# Patient Record
Sex: Male | Born: 1950 | Hispanic: No | Marital: Married | Smoking: Never smoker
Health system: Southern US, Community
[De-identification: ages and names within clinical notes are randomized; demographics above are authoritative.]

## PROBLEM LIST (undated history)

## (undated) DIAGNOSIS — I10 Essential (primary) hypertension: Secondary | ICD-10-CM

---

## 2017-01-30 ENCOUNTER — Ambulatory Visit: Payer: PRIVATE HEALTH INSURANCE | Attending: Internal Medicine

## 2017-01-30 DIAGNOSIS — M5441 Lumbago with sciatica, right side: Secondary | ICD-10-CM | POA: Diagnosis present

## 2017-01-30 DIAGNOSIS — M6281 Muscle weakness (generalized): Secondary | ICD-10-CM | POA: Diagnosis present

## 2017-01-30 DIAGNOSIS — G8929 Other chronic pain: Secondary | ICD-10-CM | POA: Diagnosis present

## 2017-01-30 DIAGNOSIS — M5442 Lumbago with sciatica, left side: Secondary | ICD-10-CM | POA: Diagnosis present

## 2017-01-30 NOTE — Therapy (Signed)
[facility] PHYS[facility] MEDICINE [address] [place], [address] Phone: [phone]   Fax:  [phone]  Physical Therapy Treatment  Patient Details  Name: Corey Mendoza. Ellery MRN: [id number] Date of Birth: [birth year] Referring Provider: Nino Glow MD   Encounter Date: 01/30/2017  PT End of Session - 01/30/17 1208    Visit Number  1    Number of Visits  13    Date for PT Re-Evaluation  03/13/17    PT Start Time  1015    PT Stop Time  1115    PT Time Calculation (min)  60 min    Activity Tolerance  Patient tolerated treatment well    Behavior During Therapy  [facility] for tasks assessed/performed       History reviewed. No pertinent past medical history.  History reviewed. No pertinent surgical history.  There were no vitals filed for this visit.  Subjective Assessment - 01/30/17 1143    Subjective  Patient reports he is currently having B leg numbness/pain radiating from the lumbar into the posterior aspect of the lower legs. Patient reports increased calf pain after walking 2 blocks, standing for ~ 3 min, and all standing weight bearing activities. Patient reports the pain is worse in the afternoon/evening and better in the morning. Patient reports the pain usually starts in the feet/back and gets worse the longer he stands. Patient reports he would like to return to walking and working out without increase in pain. Patient reports the pain has not been improving since onset  1 month ago.    Pertinent History  History of LBP, HTN    Limitations  Standing;Walking    How long can you stand comfortably?  3 min    How long can you walk comfortably?  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  Yes worst: 9/10; best 5/10    Pain Score  5     Pain Location  Back    Pain Orientation  Right;Left    Pain Descriptors /  Indicators  Aching;Numbness    Pain Type  Chronic pain    Pain Onset  More than a month ago    Pain Frequency  Constant         [facility] PT Assessment - 01/30/17 1025      Assessment   Medical Diagnosis  Chronic back pain m54.5    Referring Provider  Nino Glow MD    Onset Date/Surgical Date  01/01/17    Hand Dominance  Right    Next MD Visit  unknown     Prior Therapy  yes - back      Balance Screen   Has the patient fallen in the past 6 months  No    Has the patient had a decrease in activity level because of a fear of falling?   Yes    Is the patient reluctant to leave their home because of a fear of falling?   No      Home Environment   Living Environment  Private residence    Living Arrangements  Spouse/significant other    Available Help at Discharge  Family    Type of Home  House    Home Access  Level entry    Home Layout  Two level  Alternate Level Stairs-Number of Steps  15    Alternate Level Stairs-Rails  Right      Prior Function   Level of Independence  Independent    Vocation  Retired    [employer] Requirements  N/A    Leisure  Gym,  swimming, movies, table tennis, running      Cognition   Overall Cognitive Status  Within Functional Limits for tasks assessed      Observation/Other Assessments   Observations  decreased glute activation with general cueing requires cueing to correct      Sensation   Additional Comments  Decreased Along S1-2 dermatomes to light touch B       Posture/Postural Control   Posture Comments  Forward rounded shoulders      ROM / Strength   AROM / PROM / Strength  Strength;AROM      AROM   AROM Assessment Site  Lumbar;Hip    Right/Left Hip  Right;Left    Right Hip Extension  10    Right Hip Flexion  120    Right Hip ABduction  40    Right Hip ADduction  20    Left Hip Extension  10    Left Hip Flexion  120    Left Hip ABduction  40    Left Hip ADduction  20    Lumbar Flexion  WNL    Lumbar Extension  75% limited  increased pain    Lumbar - Right Side Bend  WNL    Lumbar - Left Side Bend  WNL    Lumbar - Right Rotation  WNL    Lumbar - Left Rotation  WNL      Strength   Strength Assessment Site  Hip;Knee;Ankle    Right/Left Hip  Right;Left    Right Hip Flexion  4+/5    Right Hip Extension  4-/5    Right Hip ABduction  4/5    Right Hip ADduction  4/5    Left Hip Flexion  4+/5    Left Hip Extension  4-/5    Left Hip ABduction  4/5    Left Hip ADduction  4/5    Right/Left Knee  Right;Left    Right Knee Flexion  4+/5    Right Knee Extension  5/5    Left Knee Flexion  4+/5    Left Knee Extension  5/5    Right/Left Ankle  Right;Left    Right Ankle Dorsiflexion  4-/5    Right Ankle Plantar Flexion  4-/5    Right Ankle Inversion  4+/5    Left Ankle Dorsiflexion  4-/5    Left Ankle Plantar Flexion  4-/5    Left Ankle Inversion  4+/5      Palpation   Palpation comment  Reproduction of symptoms with PA mobs to L1, L4-5; TTP over multifidi B      Special Tests    Special Tests  Lumbar    Lumbar Tests  Slump Test;FABER test;Prone Knee Bend Test;Straight Leg Raise      FABER test   findings  Negative    Comment  Bilat      Slump test   Findings  Positive    Side  Right      Prone Knee Bend Test   Findings  Positive    Side  Left      Straight Leg Raise   Findings  Negative    Comment  bilat      Ambulation/Gait  Gait Comments  slight hip ER with ambulation        TREATMENT: Therapeutic Exercise: Prone press ups -- 2 x 10  Prone lying on elbows -- x 2 min Bridges -- x 5   Patient demonstrates less Rt leg pain; but greater Lt leg pain at end of session     PT Education - 01/30/17 1203    Education provided  Yes    Education Details  Prone lying for 15 min    Person(s) Educated  Patient    Methods  Explanation;Demonstration    Comprehension  Verbalized understanding;Returned demonstration          PT Long Term Goals - 01/30/17 1239      PT LONG TERM GOAL #1    Title  Patient will be independent with HEP to continue benefits of therapy after discharge.    Baseline  Patient dependent with exercise performance/technique    Time  6    Period  Weeks    Status  New    Target Date  02/27/17      PT LONG TERM GOAL #2   Title  Patient will be able to stand for 1 hour without increase in pain to better be able to perform recreational and functional activities such as table tennis    Baseline  3 min     Time  6    Period  Weeks    Status  New    Target Date  03/13/17      PT LONG TERM GOAL #3   Title  Patient will be able to walk and workout without increase in pain to better be able to maintain function without increased pain.    Baseline  can walk 900 ft before requiring to sit down    Time  6    Period  Weeks    Status  New    Target Date  03/13/17            Plan - 01/30/17 1232    Clinical Impression Statement  Patient is a R hand dominant male presenting with increased pain and numbness along his low back and posterior aspect of his lower legs. Patient demonstrates lumbar dysfunction with possible disc involvement with increased pain with SLUMP, radicular pain B and increased pain with performing extension based movement. Patient also demonstrates decreased standing tolerance and calf/ hip weakness B. Patient will benefit from further skilled therapy focused on improving limitations to return to prior level of function.     History and Personal Factors relevant to plan of care:  Chronic LBP, HTN    Clinical Presentation  Evolving    Clinical Presentation due to:  Pain not improving since onset    Clinical Decision Making  High    Rehab Potential  Fair    Clinical Impairments Affecting Rehab Potential  (-) pain radiating down B LEs    PT Frequency  2x / week    PT Duration  6 weeks    PT Treatment/Interventions  Therapeutic activities;Therapeutic exercise;Moist Heat;Cryotherapy;Ultrasound;Electrical Stimulation;Iontophoresis 4mg /ml  Dexamethasone;Neuromuscular re-education;Patient/family education;Passive range of motion;Manual techniques;Dry needling    PT Next Visit Plan  Progress positioning    PT Home Exercise Plan  See education    Consulted and Agree with Plan of Care  Patient       Patient will benefit from skilled therapeutic intervention in order to improve the following deficits and impairments:  Abnormal gait, Pain, Decreased mobility, Decreased range of motion, Decreased  endurance, Decreased activity tolerance, Difficulty walking, Decreased balance, Increased muscle spasms  Visit Diagnosis: Chronic bilateral low back pain with bilateral sciatica - Plan: PT plan of care cert/re-cert  Muscle weakness (generalized) - Plan: PT plan of care cert/re-cert     Problem List There are no active problems to display for this patient.   [name], PT DPT 01/30/2017, 12:53 PM  [place] [facility] PHYSICAL AND SPORTS MEDICINE [address], [place], [address] Phone: [phone]   Fax:  [phone]  Name: Eligha Kmetz. Fariss MRN: [id number] Date of Birth: Aug 02, 1950

## 2017-02-04 ENCOUNTER — Ambulatory Visit: Payer: PRIVATE HEALTH INSURANCE | Attending: Internal Medicine

## 2017-02-04 DIAGNOSIS — G8929 Other chronic pain: Secondary | ICD-10-CM | POA: Insufficient documentation

## 2017-02-04 DIAGNOSIS — M6281 Muscle weakness (generalized): Secondary | ICD-10-CM | POA: Diagnosis not present

## 2017-02-04 DIAGNOSIS — M5442 Lumbago with sciatica, left side: Secondary | ICD-10-CM | POA: Insufficient documentation

## 2017-02-04 DIAGNOSIS — M5441 Lumbago with sciatica, right side: Secondary | ICD-10-CM | POA: Diagnosis present

## 2017-02-04 NOTE — Therapy (Signed)
[place] [facility]ICINE [address], [place], [address] Phone: [phone]   Fax:  [phone]  Physical Therapy Treatment  Patient Details  Name: Corey Mendoza MRN: [id number] Date of Birth: [birth year] Referring Provider: Nino Glow MD   Encounter Date: 02/04/2017  PT End of Session - 02/04/17 0944    Visit Number  2    Number of Visits  13    Date for PT Re-Evaluation  03/13/17    PT Start Time  0845    PT Stop Time  0930    PT Time Calculation (min)  45 min    Activity Tolerance  Patient tolerated treatment well    Behavior During Therapy  [facility] for tasks assessed/performed       History reviewed. No pertinent past medical history.  History reviewed. No pertinent surgical history.  There were no vitals filed for this visit.  Subjective Assessment - 02/04/17 0900    Subjective  Patient reports his pain is not improved compared to the previous session. Patient states he continues to have pain radiating down his legs bilaterally.     Pertinent History  History of LBP, HTN    Limitations  Standing;Walking    How long can you stand comfortably?  3 min    How long can you walk comfortably?  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  Yes    Pain Score  6     Pain Location  Back    Pain Orientation  Right;Left    Pain Descriptors / Indicators  Aching;Numbness    Pain Onset  More than a month ago    Pain Frequency  Constant       TREATMENT Therapeutic Exercise LTR with physioball - x30 B in hooklying Gastroc isometrics in supine - x20 with 5 sec holds B against PT resitan Knees to chest with physioball - x 40 with 2 sec holds in hooklying Dead bugs in prone - x 20 with cueing to perform abdominal bracing Bridges - x 5 stopped secondary to pain Prone on elbows - stopped secondary to  increased numbness/tingling Lying prone - initially decreases pain in feet - increased after attempt to perform prone press ups  Manual Therapy: Mobilizations: L1-5 - grade III-IV 2 x 30sec to improve extension Range of motion; no pain with performance today, which is an improvement compared to the previous session.  Modalities [place] with patient positioned in prone to improve pain and spasm using small sound head over the lumbar spine. Parameters 1 MHz, 1.8 mWs, , and a 100% duty cycle.   Patient reports no increase in pain at the end of the session   PT Education - 02/04/17 0942    Education provided  Yes    Education Details  HEP: knees to chest, resisted isometrics for gastroc,, deadbug    Person(s) Educated  Patient    Methods  Explanation;Demonstration    Comprehension  Verbalized understanding;Returned demonstration          PT Long Term Goals - 01/30/17 1239      PT LONG TERM GOAL #1   Title  Patient will be independent with HEP to continue benefits of therapy after discharge.    Baseline  Patient dependent with exercise performance/technique    Time  6  Period  Weeks    Status  New    Target Date  02/27/17      PT LONG TERM GOAL #2   Title  Patient will be able to stand for 1 hour without increase in pain to better be able to perform recreational and functional activities such as table tennis    Baseline  3 min     Time  6    Period  Weeks    Status  New    Target Date  03/13/17      PT LONG TERM GOAL #3   Title  Patient will be able to walk and workout without increase in pain to better be able to maintain function without increased pain.    Baseline  can walk 900 ft before requiring to sit down    Time  6    Period  Weeks    Status  New    Target Date  03/13/17            Plan - 02/04/17 0944    Clinical Impression Statement  Patient continues to demonstrate increased pain and numbness along B posterior aspect of the lower legs. Patient  demonstrates decreased pain with performing prone lying, but symptoms increase after trying to perform prone press ups and increased extension passed neutral. Patient reports decrease in symtpoms with gastroc strengthening and knees to chest exercises. Patient will benefit from further skilled therapy to return to prior level of function.     Rehab Potential  Fair    Clinical Impairments Affecting Rehab Potential  (-) pain radiating down B LEs    PT Frequency  2x / week    PT Duration  6 weeks    PT Treatment/Interventions  Therapeutic activities;Therapeutic exercise;Moist Heat;Cryotherapy;Ultrasound;Electrical Stimulation;Iontophoresis 4mg /ml Dexamethasone;Neuromuscular re-education;Patient/family education;Passive range of motion;Manual techniques;Dry needling    PT Next Visit Plan  Progress positioning    PT Home Exercise Plan  See education    Consulted and Agree with Plan of Care  Patient       Patient will benefit from skilled therapeutic intervention in order to improve the following deficits and impairments:  Abnormal gait, Pain, Decreased mobility, Decreased range of motion, Decreased endurance, Decreased activity tolerance, Difficulty walking, Decreased balance, Increased muscle spasms  Visit Diagnosis: Muscle weakness (generalized)  Chronic bilateral low back pain with bilateral sciatica     Problem List There are no active problems to display for this patient.   Myrene GalasWesley Tyrann Donaho, PT DPT 02/04/2017, 10:30 AM  [place] [facility] [address] [place], [place], [address] Phone: [phone]   Fax:  [phone]  Name: Corey Mendoza MRN: [id number] Date of Birth: April 12, 1950

## 2017-02-06 ENCOUNTER — Ambulatory Visit: Payer: PRIVATE HEALTH INSURANCE

## 2017-02-12 ENCOUNTER — Ambulatory Visit: Payer: PRIVATE HEALTH INSURANCE

## 2017-02-12 DIAGNOSIS — M6281 Muscle weakness (generalized): Secondary | ICD-10-CM | POA: Diagnosis not present

## 2017-02-12 DIAGNOSIS — G8929 Other chronic pain: Secondary | ICD-10-CM

## 2017-02-12 DIAGNOSIS — M5441 Lumbago with sciatica, right side: Principal | ICD-10-CM

## 2017-02-12 DIAGNOSIS — M5442 Lumbago with sciatica, left side: Principal | ICD-10-CM

## 2017-02-12 NOTE — Therapy (Signed)
[place] [facility] PH[facility]TS MEDICINE [address], [place], [address] Phone: [phone]   Fax:  [phone]  Physical Therapy Treatment  Patient Details  Name: Corey Mendoza MRN: [id number] Date of Birth: Dec 23, 1950 Referring Provider: Nino Glow MD   Encounter Date: 02/12/2017  PT End of Session - 02/12/17 1439    Visit Number  3    Number of Visits  13    Date for PT Re-Evaluation  03/13/17    PT Start Time  1345    PT Stop Time  1430    PT Time Calculation (min)  45 min    Activity Tolerance  Patient tolerated treatment well    Behavior During Therapy  [facility] for tasks assessed/performed       History reviewed. No pertinent past medical history.  History reviewed. No pertinent surgical history.  There were no vitals filed for this visit.  Subjective Assessment - 02/12/17 1432    Subjective  Patient reports his pain has not improved since the previous visit. Patient reports he has not been performing his HEP.     Pertinent History  History of LBP, HTN    Limitations  Standing;Walking    How long can you stand comfortably?  3 min    How long can you walk comfortably?  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  Yes    Pain Score  5     Pain Location  Leg    Pain Orientation  Lower    Pain Descriptors / Indicators  Aching;Numbness    Pain Type  Chronic pain    Pain Onset  More than a month ago    Pain Frequency  Constant       TREATMENT  Manual Therapy: STM performed to patients gastrocnemius/soleus complex with patient positioned in prone B to improve pain and spasms along the gastroc/soleus area. ~12 min performed on each side. Nerve mobilizations performed in prone to improve neuro tension on the R side -- x 20   Modalities [place] with patient positioned in prone to improve pain and spasm using  small sound head over the lumbar spine. Parameters 1 MHz, 2.5 W/cm2s, , and a 100% duty cycle.   Patient reports decrease in pain at end of the session      PT Education - 02/12/17 1439    Education provided  Yes    Education Details  neurophysiology    Person(s) Educated  Patient    Methods  Explanation;Demonstration    Comprehension  Verbalized understanding;Returned demonstration          PT Long Term Goals - 01/30/17 1239      PT LONG TERM GOAL #1   Title  Patient will be independent with HEP to continue benefits of therapy after discharge.    Baseline  Patient dependent with exercise performance/technique    Time  6    Period  Weeks    Status  New    Target Date  02/27/17      PT LONG TERM GOAL #2   Title  Patient will be able to stand for 1 hour without increase in pain to better be able to perform recreational and functional activities such as table tennis    Baseline  3 min     Time  6    Period  Weeks    Status  New    Target Date  03/13/17      PT LONG TERM GOAL #3   Title  Patient will be able to walk and workout without increase in pain to better be able to maintain function without increased pain.    Baseline  can walk 900 ft before requiring to sit down    Time  6    Period  Weeks    Status  New    Target Date  03/13/17            Plan - 02/12/17 1440    Clinical Impression Statement  Patient continues to demonstrate decreased pain after performing manual therapy indicating decreased pain and spasms along the calves. Performed all manual therapy and modalities in prone to improve lumbar positioning and mackenzie based extension. Although patient has improvement in symptoms, he continues to have increased pain after walking. Patient will benefit from further skilled therapy to return to prior level of function.     Rehab Potential  Fair    Clinical Impairments Affecting Rehab Potential  (-) pain radiating down B LEs    PT Frequency  2x /  week    PT Duration  6 weeks    PT Treatment/Interventions  Therapeutic activities;Therapeutic exercise;Moist Heat;Cryotherapy;Ultrasound;Electrical Stimulation;Iontophoresis 4mg /ml Dexamethasone;Neuromuscular re-education;Patient/family education;Passive range of motion;Manual techniques;Dry needling    PT Next Visit Plan  Progress positioning    PT Home Exercise Plan  See education    Consulted and Agree with Plan of Care  Patient       Patient will benefit from skilled therapeutic intervention in order to improve the following deficits and impairments:  Abnormal gait, Pain, Decreased mobility, Decreased range of motion, Decreased endurance, Decreased activity tolerance, Difficulty walking, Decreased balance, Increased muscle spasms  Visit Diagnosis: Chronic bilateral low back pain with bilateral sciatica  Muscle weakness (generalized)     Problem List There are no active problems to display for this patient.   Myrene GalasWesley Idamae Coccia, PT DPT 02/12/2017, 2:48 PM  [place] [facility] [address] [place], [place], [address] Phone: [phone]   Fax:  [phone]  Name: Corey Mendoza MRN: [id number] Date of Birth: [birth year]

## 2017-02-14 ENCOUNTER — Ambulatory Visit: Payer: PRIVATE HEALTH INSURANCE

## 2017-02-14 DIAGNOSIS — M6281 Muscle weakness (generalized): Secondary | ICD-10-CM | POA: Diagnosis not present

## 2017-02-14 DIAGNOSIS — M5441 Lumbago with sciatica, right side: Principal | ICD-10-CM

## 2017-02-14 DIAGNOSIS — G8929 Other chronic pain: Secondary | ICD-10-CM

## 2017-02-14 DIAGNOSIS — M5442 Lumbago with sciatica, left side: Principal | ICD-10-CM

## 2017-02-14 NOTE — Therapy (Signed)
[place] [facility]INE [address] [place], [address] Phone: [phone]   Fax:  [phone]  Physical Therapy Treatment  Patient Details  Name: Corey Mendoza MRN: [id number] Date of Birth: 12/20/1950 Referring Provider: Nino Glow MD   Encounter Date: 02/14/2017  PT End of Session - 02/14/17 1539    Visit Number  4    Number of Visits  13    Date for PT Re-Evaluation  03/13/17    PT Start Time  1500    PT Stop Time  1545    PT Time Calculation (min)  45 min    Activity Tolerance  Patient tolerated treatment well    Behavior During Therapy  [facility] for tasks assessed/performed       History reviewed. No pertinent past medical history.  History reviewed. No pertinent surgical history.  There were no vitals filed for this visit.  Subjective Assessment - 02/14/17 1536    Subjective  Patient reports his pain has improved 50% since the previous visit. Reports he has stopped drinking and his pain has been better. Patient also reports what we did the previous treatment session has helped his paiin considerably.     Pertinent History  History of LBP, HTN    Limitations  Standing;Walking    How long can you stand comfortably?  3 min    How long can you walk comfortably?  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  Yes    Pain Score  3     Pain Location  Leg    Pain Orientation  Lower    Pain Onset  More than a month ago    Pain Frequency  Constant         TREATMENT Manual Therapy: STM performed to patients gastrocnemius/soleus complex with patient positioned in prone B to improve pain and spasms along the gastroc/soleus area. ~12 min performed on each side. Nerve mobilizations performed in sitting to improve neuro tension on the R side -- x 20   Therapeutic Exercise: Birddog -- x10  B Deadbug -- x10 B Thoracic sit ups -- x10  Dry Needling:  Performed to gastrocnemius (3) .25 x 60mm needles placed along the gastrocs to decrease increased pain and spasms with patient positioned in prone. Patient gives verbal consents to treatment and is educated on precautions and benefits of treatment  Modalities [place] with patient positioned in prone to improve pain and spasm using small sound head over the lumbar spine. Parameters 1 MHz, 2.5 W/cm2s, , and a 100% duty cycle.   Patient reports decrease in pain at end of the session    PT Education - 02/14/17 1538    Education provided  Yes    Education Details  HEP: bird dog, dead bug, thoracic sit up    Person(s) Educated  Patient    Methods  Explanation;Demonstration    Comprehension  Returned demonstration;Verbalized understanding          PT Long Term Goals - 01/30/17 1239      PT LONG TERM GOAL #1   Title  Patient will be independent with HEP to continue benefits of therapy after discharge.    Baseline  Patient dependent with exercise performance/technique    Time  6    Period  Weeks  Status  New    Target Date  02/27/17      PT LONG TERM GOAL #2   Title  Patient will be able to stand for 1 hour without increase in pain to better be able to perform recreational and functional activities such as table tennis    Baseline  3 min     Time  6    Period  Weeks    Status  New    Target Date  03/13/17      PT LONG TERM GOAL #3   Title  Patient will be able to walk and workout without increase in pain to better be able to maintain function without increased pain.    Baseline  can walk 900 ft before requiring to sit down    Time  6    Period  Weeks    Status  New    Target Date  03/13/17            Plan - 02/14/17 1540    Clinical Impression Statement  Patient demonstrates decreased pain after performing manual therapy and ultrasound indicating decreased muscular spasms and pain along the gastrocs.  Although patient is improving, he continues to have increased numbness around the gastrocs. Attempted abdominal bracing and neuroglides with therapy today and patient responds well with no increase in pain. Patient will benefit from further skilled therapy focused on improving limitations to return to prior level of function.      Rehab Potential  Fair    Clinical Impairments Affecting Rehab Potential  (-) pain radiating down B LEs    PT Frequency  2x / week    PT Duration  6 weeks    PT Treatment/Interventions  Therapeutic activities;Therapeutic exercise;Moist Heat;Cryotherapy;Ultrasound;Electrical Stimulation;Iontophoresis 4mg /ml Dexamethasone;Neuromuscular re-education;Patient/family education;Passive range of motion;Manual techniques;Dry needling    PT Next Visit Plan  Progress positioning    PT Home Exercise Plan  See education    Consulted and Agree with Plan of Care  Patient       Patient will benefit from skilled therapeutic intervention in order to improve the following deficits and impairments:  Abnormal gait, Pain, Decreased mobility, Decreased range of motion, Decreased endurance, Decreased activity tolerance, Difficulty walking, Decreased balance, Increased muscle spasms  Visit Diagnosis: Chronic bilateral low back pain with bilateral sciatica  Muscle weakness (generalized)     Problem List There are no active problems to display for this patient.   Myrene GalasWesley San Lohmeyer, PT DPT 02/14/2017, 3:43 PM  [facility] [address] [place], [place], [address] Phone: [phone]   Fax:  [phone]  Name: Corey Mendoza MRN: [id number] Date of Birth: 1950/03/22

## 2017-02-18 ENCOUNTER — Ambulatory Visit: Payer: PRIVATE HEALTH INSURANCE

## 2017-02-18 DIAGNOSIS — M5442 Lumbago with sciatica, left side: Principal | ICD-10-CM

## 2017-02-18 DIAGNOSIS — G8929 Other chronic pain: Secondary | ICD-10-CM

## 2017-02-18 DIAGNOSIS — M6281 Muscle weakness (generalized): Secondary | ICD-10-CM

## 2017-02-18 DIAGNOSIS — M5441 Lumbago with sciatica, right side: Principal | ICD-10-CM

## 2017-02-18 NOTE — Therapy (Signed)
[place] [facility] [address] , [place], [address] Phone: [phone]   Fax:  [phone]  Physical Therapy Treatment  Patient Details  Name: Corey Mendoza MRN: [id number] Date of Birth: May 21, 1950 Referring Provider: Nino GlowMelam Khan MD   Encounter Date: 02/18/2017  PT End of Session - 02/18/17 1407    Visit Number  5    Number of Visits  13    Date for PT Re-Evaluation  03/13/17    PT Start Time  1300    PT Stop Time  1345    PT Time Calculation (min)  45 min    Activity Tolerance  Patient tolerated treatment well    Behavior During Therapy  [facility] tasks assessed/performed       History reviewed. No pertinent past medical history.  History reviewed. No pertinent surgical history.  There were no vitals filed for this visit.  Subjective Assessment - 02/18/17 1301    Subjective  Patient reports his pain was worse the pain afterwards but felt better the next two days following the last session.     Pertinent History  History of LBP, HTN    Limitations  Standing;Walking    How long can you stand comfortably?  3 min    How long can you walk comfortably?  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  Yes    Pain Score  4     Pain Location  Leg    Pain Orientation  Lower    Pain Descriptors / Indicators  Aching    Pain Type  Chronic pain    Pain Onset  More than a month ago    Pain Frequency  Constant        TREATMENT Manual Therapy: STM performed to patients gastrocnemius/soleus complex with patient positioned in prone B to improve pain and spasms along the gastroc/soleus area. ~12 min performed on each side. Nerve mobilizations performed in sitting to improve neuro tension on the R side -- x 20    Therapeutic Exercise: Hip Abduction in sidelying - x 10 with frequent cueing from  PT Glute squeezes in prone - x 10    Modalities [place] with patient positioned in prone to improve pain and spasm using small sound head over the lumbar spine. Parameters 1 MHz, 2.2 W/cm2s, 10min, and a 100% duty cycle.    Patient reports decrease in pain at end of the session  PT Education - 02/18/17 1406    Education provided  Yes    Education Details  HEP: glute squeezes, hip abduction in s/l    Person(s) Educated  Patient    Methods  Explanation;Demonstration    Comprehension  Verbalized understanding;Returned demonstration          PT Long Term Goals - 01/30/17 1239      PT LONG TERM GOAL #1   Title  Patient will be independent with HEP to continue benefits of therapy after discharge.    Baseline  Patient dependent with exercise performance/technique    Time  6    Period  Weeks    Status  New    Target Date  02/27/17      PT LONG TERM GOAL #2   Title  Patient will be able to stand for 1 hour without increase  in pain to better be able to perform recreational and functional activities such as table tennis    Baseline  3 min     Time  6    Period  Weeks    Status  New    Target Date  03/13/17      PT LONG TERM GOAL #3   Title  Patient will be able to walk and workout without increase in pain to better be able to maintain function without increased pain.    Baseline  can walk 900 ft before requiring to sit down    Time  6    Period  Weeks    Status  New    Target Date  03/13/17            Plan - 02/18/17 1408    Clinical Impression Statement  Patient demonstrates decreased pain after performing manual therapy mobilizations to the SIJ and patient demonstrates increased atrophy along his gluteals B. Patient is improving with resitng pain levels, but continues to have increased pain with prolonged standing and ambulation. Patient will benefit from further skilled therapy to return to prior level of function.     Rehab Potential  Fair    Clinical Impairments Affecting  Rehab Potential  (-) pain radiating down B LEs    PT Frequency  2x / week    PT Duration  6 weeks    PT Treatment/Interventions  Therapeutic activities;Therapeutic exercise;Moist Heat;Cryotherapy;Ultrasound;Electrical Stimulation;Iontophoresis 4mg /ml Dexamethasone;Neuromuscular re-education;Patient/family education;Passive range of motion;Manual techniques;Dry needling    PT Next Visit Plan  Progress positioning    PT Home Exercise Plan  See education    Consulted and Agree with Plan of Care  Patient       Patient will benefit from skilled therapeutic intervention in order to improve the following deficits and impairments:  Abnormal gait, Pain, Decreased mobility, Decreased range of motion, Decreased endurance, Decreased activity tolerance, Difficulty walking, Decreased balance, Increased muscle spasms  Visit Diagnosis: Chronic bilateral low back pain with bilateral sciatica  Muscle weakness (generalized)     Problem List There are no active problems to display for this patient.   [name], PT DPT 02/18/2017, 2:41 PM  [facility] PHYSICAL AND SPORTS MEDICINE [address] [place], [address] Phone: [phone]   Fax:  [phone]  Name: Corey Mendoza MRN: [id number] Date of Birth: July 20, 1950

## 2017-02-21 ENCOUNTER — Ambulatory Visit: Payer: PRIVATE HEALTH INSURANCE

## 2017-02-21 DIAGNOSIS — G8929 Other chronic pain: Secondary | ICD-10-CM

## 2017-02-21 DIAGNOSIS — M6281 Muscle weakness (generalized): Secondary | ICD-10-CM | POA: Diagnosis not present

## 2017-02-21 DIAGNOSIS — M5441 Lumbago with sciatica, right side: Principal | ICD-10-CM

## 2017-02-21 DIAGNOSIS — M5442 Lumbago with sciatica, left side: Principal | ICD-10-CM

## 2017-02-21 NOTE — Therapy (Signed)
[place] [facility] [address] [place], [place], [address] Phone: [phone]   Fax:  [phone]  Physical Therapy Treatment  Patient Details  Name: Corey Mendoza MRN: [id number] Date of Birth: 1950/08/28 Referring Provider: Nino GlowMelam Khan MD   Encounter Date: 02/21/2017  PT End of Session - 02/21/17 1433    Visit Number  6    Number of Visits  13    Date for PT Re-Evaluation  03/13/17    PT Start Time  1335    PT Stop Time  1447    PT Time Calculation (min)  72 min    Activity Tolerance  Patient tolerated treatment well    Behavior During Therapy  [facility] assessed/[facility]       History reviewed. No pertinent past medical history.  History reviewed. No pertinent surgical history.  There were no vitals filed for this visit.  Subjective Assessment - 02/21/17 1340    Subjective  Patient states he has "a little bit" of pain today but that it is much better compared to when he started coming to PT.    Pertinent History  History of LBP, HTN    Limitations  Standing;Walking    How long can you stand comfortably?  3 min    How long can you walk comfortably?  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  Yes    Pain Score  4     Pain Location  Leg    Pain Orientation  Lower    Pain Descriptors / Indicators  Aching    Pain Type  Chronic pain    Pain Onset  More than a month ago    Pain Frequency  Constant        Treatment   Manual Therapy:   STM utilizing superficial technique and tool assisted STM performed to patients gastrocnemius/soleus complex with patient positioned in prone B to improve pain and spasms along the gastroc/soleus area.    Therapeutic Exercise: Prone press ups - x8 to decrease LBP Standing back extension - x8 Heel raises 1x20 to increase endurance of the  plantarflexors Standing hip extension - 2x10 to decrease LBP   Modalities (20min): with patient in prone   One Large moist heat pad placed along the low back with patient positioned in prone to decrease pain and spasms within the low back musculature. High volt placed along lumbar multifidus on the R and L side to decrease increased pain and spasms; two large pads on the R and L lumbar multifidus. Patient educated on treatment    Pt demonstrates decreased pain at end of session   PT Education - 02/21/17 1433    Education provided  Yes    Education Details  Form/technique with exercise    Person(s) Educated  Patient    Methods  Explanation;Demonstration    Comprehension  Verbalized understanding;Returned demonstration          PT Long Term Goals - 01/30/17 1239      PT LONG TERM GOAL #1   Title  Patient will be independent with HEP to continue benefits of therapy after discharge.    Baseline  Patient dependent with exercise performance/technique    Time  6    Period  Weeks    Status  New  Target Date  02/27/17      PT LONG TERM GOAL #2   Title  Patient will be able to stand for 1 hour without increase in pain to better be able to perform recreational and functional activities such as table tennis    Baseline  3 min     Time  6    Period  Weeks    Status  New    Target Date  03/13/17      PT LONG TERM GOAL #3   Title  Patient will be able to walk and workout without increase in pain to better be able to maintain function without increased pain.    Baseline  can walk 900 ft before requiring to sit down    Time  6    Period  Weeks    Status  New    Target Date  03/13/17            Plan - 02/21/17 1433    Clinical Impression Statement  Patient continues to demonstrate overall decrease in pain between sessions, indicating functional carryover between sessoins. Although patient demonstrates improvement, pt continues to demonstrate increased pain with exercise  performed in standing. Pt will benefit from further skilled therapy to return to prior levels of function.    Rehab Potential  Fair    Clinical Impairments Affecting Rehab Potential  (-) pain radiating down B LEs    PT Frequency  2x / week    PT Duration  6 weeks    PT Treatment/Interventions  Therapeutic activities;Therapeutic exercise;Moist Heat;Cryotherapy;Ultrasound;Electrical Stimulation;Iontophoresis 4mg /ml Dexamethasone;Neuromuscular re-education;Patient/family education;Passive range of motion;Manual techniques;Dry needling    PT Next Visit Plan  Progress positioning    PT Home Exercise Plan  See education    Consulted and Agree with Plan of Care  Patient       Patient will benefit from skilled therapeutic intervention in order to improve the following deficits and impairments:  Abnormal gait, Pain, Decreased mobility, Decreased range of motion, Decreased endurance, Decreased activity tolerance, Difficulty walking, Decreased balance, Increased muscle spasms  Visit Diagnosis: Chronic bilateral low back pain with bilateral sciatica  Muscle weakness (generalized)     Problem List There are no active problems to display for this patient.   Temple Pacini, SPT 02/21/2017, 2:52 PM  [place] [facility] PHYSICAL AND SPORTS MEDICINE [address] [place], [address] Phone: [phone]   Fax:  [phone]  Name: Corey Mendoza MRN: [id number] Date of Birth: September 07, 1950

## 2017-02-25 ENCOUNTER — Ambulatory Visit: Payer: PRIVATE HEALTH INSURANCE | Admitting: Physical Therapy

## 2017-02-25 ENCOUNTER — Encounter: Payer: Self-pay | Admitting: Physical Therapy

## 2017-02-25 DIAGNOSIS — M5441 Lumbago with sciatica, right side: Principal | ICD-10-CM

## 2017-02-25 DIAGNOSIS — G8929 Other chronic pain: Secondary | ICD-10-CM

## 2017-02-25 DIAGNOSIS — M5442 Lumbago with sciatica, left side: Principal | ICD-10-CM

## 2017-02-25 DIAGNOSIS — M6281 Muscle weakness (generalized): Secondary | ICD-10-CM

## 2017-02-25 NOTE — Therapy (Signed)
[facility] PHYS[facility]TS MEDICINE [address] [place], [address] Phone: [phone]   Fax:  [phone]  Physical Therapy Treatment  Patient Details  Name: Corey Mendoza MRN: [id number] Date of Birth: 1950-04-16 Referring Provider: Nino Glow MD   Encounter Date: 02/25/2017  PT End of Session - 02/25/17 1257    Visit Number  7    Number of Visits  13    Date for PT Re-Evaluation  03/13/17    PT Start Time  1300    PT Stop Time  1350    PT Time Calculation (min)  50 min    Activity Tolerance  Patient tolerated treatment well    Behavior During Therapy  [facility] for tasks assessed/performed       History reviewed. No pertinent past medical history.  History reviewed. No pertinent surgical history.  There were no vitals filed for this visit.  Subjective Assessment - 02/25/17 1300    Subjective  Pt reports some improvement since last session.  His pain is down to 3-4/10.  Pain is still worse in the evening.  Pt has been completing his HEP only 1-2 days/wk.      Pertinent History  History of LBP, HTN    Limitations  Standing;Walking    How long can you stand comfortably?  3 min    How long can you walk comfortably?  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  Yes    Pain Score  4     Pain Location  Foot    Pain Orientation  Right;Left    Pain Descriptors / Indicators  Aching;Numbness    Pain Onset  More than a month ago    Multiple Pain Sites  No       TREATMENT   Manual Therapy:  STM to patient's gastrocnemius/soleus complex with patient positioned in prone B to improve pain and spasms along the gastroc/soleus area. Pt reports decrease in symptoms following.    Therapeutic Exercise:  Prone press ups - x10 to decrease LBP   Prone press ups with manual overpressure to lower lumbar spine in PA  motion x5   BLE bridges with cues for glute activation throughout 3x10.   Seated RLE sciatic nerve glides. 2x10   Standing back extension - x10   Heel raises in standing 2x10   Standing hip extension - x20 each LE to decrease LBP    Modalities ( ): with patient in prone. High Volt.  One Large moist heat pad placed along the low back with patient positioned in prone to decrease pain and spasms within the low back musculature. High volt placed along lumbar multifidus on the R and L side to decrease increased pain and spasms; two large pads on the R and L lumbar multifidus. Patient educated on treatment?                         PT Education - 02/25/17 1256    Education provided  Yes    Education Details  Exercise technique; emphasized importance of completing HEP at least 5 days/wk.     Person(s) Educated  Patient    Methods  Explanation;Demonstration    Comprehension  Verbalized understanding;Returned demonstration;Need further instruction;Verbal cues required  PT Long Term Goals - 01/30/17 1239      PT LONG TERM GOAL #1   Title  Patient will be independent with HEP to continue benefits of therapy after discharge.    Baseline  Patient dependent with exercise performance/technique    Time  6    Period  Weeks    Status  New    Target Date  02/27/17      PT LONG TERM GOAL #2   Title  Patient will be able to stand for 1 hour without increase in pain to better be able to perform recreational and functional activities such as table tennis    Baseline  3 min     Time  6    Period  Weeks    Status  New    Target Date  03/13/17      PT LONG TERM GOAL #3   Title  Patient will be able to walk and workout without increase in pain to better be able to maintain function without increased pain.    Baseline  can walk 900 ft before requiring to sit down    Time  6    Period  Weeks    Status  New    Target Date  03/13/17            Plan -  02/25/17 1304    Clinical Impression Statement  Pt reports decrease in symptoms follow STM to Bil gastroc/soleus.  Pt reports he feels he is 50% better since starting therapy.  Pt noted to demonstate hypomobility in lower lumbar spine when demonstrating prone press-ups so introduced mobilization with movement this session.  Pt's symptoms continue to increase in Bil lower legs when ambulating.  Suspicious for vascular claudication, palpable Bil posterior tibialis pulse.  Pt reports he had studies done ~2 months ago to test his blood flow in BLEs which were negative.  Pt has only been completing his HEP 1-2 days/wk.  Emphasized the importance of completing his HEP at least 5 days/wk moving forward.  Pt will benefit from continued skilled PT interventions for decreased pain and improved QOL.     Rehab Potential  Fair    Clinical Impairments Affecting Rehab Potential  (-) pain radiating down B LEs    PT Frequency  2x / week    PT Duration  6 weeks    PT Treatment/Interventions  Therapeutic activities;Therapeutic exercise;Moist Heat;Cryotherapy;Ultrasound;Electrical Stimulation;Iontophoresis 4mg /ml Dexamethasone;Neuromuscular re-education;Patient/family education;Passive range of motion;Manual techniques;Dry needling    PT Next Visit Plan  Progress positioning    PT Home Exercise Plan  See education    Consulted and Agree with Plan of Care  Patient       Patient will benefit from skilled therapeutic intervention in order to improve the following deficits and impairments:  Abnormal gait, Pain, Decreased mobility, Decreased range of motion, Decreased endurance, Decreased activity tolerance, Difficulty walking, Decreased balance, Increased muscle spasms  Visit Diagnosis: Chronic bilateral low back pain with bilateral sciatica  Muscle weakness (generalized)     Problem List There are no active problems to display for this patient.   Encarnacion ChuAshley Abashian PT, DPT 02/25/2017, 2:04 PM  [facility] [address] [place], [place], [address] Phone: [phone]   Fax:  [phone]  Name: Corey Mendoza MRN: [id number] Date of Birth: 1950-10-21

## 2017-02-28 ENCOUNTER — Ambulatory Visit: Payer: PRIVATE HEALTH INSURANCE

## 2017-02-28 DIAGNOSIS — M5442 Lumbago with sciatica, left side: Secondary | ICD-10-CM

## 2017-02-28 DIAGNOSIS — G8929 Other chronic pain: Secondary | ICD-10-CM

## 2017-02-28 DIAGNOSIS — M6281 Muscle weakness (generalized): Secondary | ICD-10-CM | POA: Diagnosis not present

## 2017-02-28 DIAGNOSIS — M5441 Lumbago with sciatica, right side: Secondary | ICD-10-CM

## 2017-02-28 NOTE — Therapy (Signed)
[facility] [address], [place], [address] Phone: [phone]   Fax:  [phone]  Physical Therapy Treatment  Patient Details  Name: Corey Bogacz. Mendoza MRN: [id number] Date of Birth: 04-29-1950 Referring Provider: Nino Glow MD   Encounter Date: 02/28/2017  PT End of Session - 02/28/17 1436    Visit Number  8    Number of Visits  13    Date for PT Re-Evaluation  03/13/17    PT Start Time  1345    PT Stop Time  1430    PT Time Calculation (min)  45 min    Activity Tolerance  Patient tolerated treatment well    Behavior During Therapy  [facility] for tasks assessed/performed       History reviewed. No pertinent past medical history.  History reviewed. No pertinent surgical history.  There were no vitals filed for this visit.  Subjective Assessment - 02/28/17 1350    Subjective  Patient reports increased pain along the R LE into the calf. Patient reports she continues to have pain radiating into the R LE.     Pertinent History  History of LBP, HTN    Limitations  Standing;Walking    How long can you stand comfortably?  3 min    How long can you walk comfortably?  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  Yes    Pain Score  4     Pain Location  Foot    Pain Orientation  Right    Pain Descriptors / Indicators  Aching    Pain Type  Chronic pain    Pain Onset  More than a month ago       Treatment  Manual Therapy:   STM utilizing superficial technique and tool assisted STM performed to patients gastrocnemius/soleus complex with patient positioned in prone B to improve pain and spasms along the gastroc/soleus area.   Mobs: lumbar mobs L4-L5 grade II -- 3 x 30sec; S1-2 2 x19min grade IV  Therapeutic Exercise: Prone press ups - x10 to decrease LBP Knees to chest -- x 30 with red  P-Ball  Bridges on ball -- x 5 Nerve glides in supine -- x 30 SLR dorsiflexion  Modalities ( ): with patient in prone (unbilled performed after session)  One Large moist heat pad placed along the low back with patient positioned in prone to decrease pain and spasms within the low back musculature. High volt placed along lumbar multifidus on the R and L side to decrease increased pain and spasms; two large pads on the R and L lumbar multifidus. Patient educated on treatment  Pt demonstrates decreased pain at end of session   PT Education - 02/28/17 1434    Education provided  Yes    Education Details  form/technique with exercise    Person(s) Educated  Patient    Methods  Explanation;Demonstration    Comprehension  Verbalized understanding;Returned demonstration          PT Long Term Goals - 01/30/17 1239      PT LONG TERM GOAL #1   Title  Patient will be independent with HEP to continue benefits of therapy after discharge.    Baseline  Patient dependent with exercise performance/technique    Time  6    Period  Weeks    Status  New    Target Date  02/27/17      PT LONG TERM GOAL #2   Title  Patient will be able to stand for 1 hour without increase in pain to better be able to perform recreational and functional activities such as table tennis    Baseline  3 min     Time  6    Period  Weeks    Status  New    Target Date  03/13/17      PT LONG TERM GOAL #3   Title  Patient will be able to walk and workout without increase in pain to better be able to maintain function without increased pain.    Baseline  can walk 900 ft before requiring to sit down    Time  6    Period  Weeks    Status  New    Target Date  03/13/17            Plan - 02/28/17 1452    Clinical Impression Statement  Patient reports his pain continues to improve and is having less calf pain along the L LE. Patient demonstrates decreased pain after performing high volt and knees to chest  exercise indicating decreased pain and spasms and improved lumbar posture and positioning. Educated patient to continue exercises and patient will benefit from further skilled therapy to return to prior level of function.     Rehab Potential  Fair    Clinical Impairments Affecting Rehab Potential  (-) pain radiating down B LEs    PT Frequency  2x / week    PT Duration  6 weeks    PT Treatment/Interventions  Therapeutic activities;Therapeutic exercise;Moist Heat;Cryotherapy;Ultrasound;Electrical Stimulation;Iontophoresis 4mg /ml Dexamethasone;Neuromuscular re-education;Patient/family education;Passive range of motion;Manual techniques;Dry needling    PT Next Visit Plan  Progress positioning    PT Home Exercise Plan  See education    Consulted and Agree with Plan of Care  Patient       Patient will benefit from skilled therapeutic intervention in order to improve the following deficits and impairments:  Abnormal gait, Pain, Decreased mobility, Decreased range of motion, Decreased endurance, Decreased activity tolerance, Difficulty walking, Decreased balance, Increased muscle spasms  Visit Diagnosis: Muscle weakness (generalized)  Chronic bilateral low back pain with bilateral sciatica     Problem List There are no active problems to display for this patient.   Myrene GalasWesley Jaimen Melone, PT DPT 02/28/2017, 3:19 PM  [facility] [address] [place], [place], [address] Phone: [phone]   Fax:  [phone]  Name: Corey Mendoza MRN: [id number] Date of Birth: [birth year]

## 2017-03-11 ENCOUNTER — Ambulatory Visit: Payer: PRIVATE HEALTH INSURANCE | Attending: Internal Medicine

## 2017-03-11 DIAGNOSIS — G8929 Other chronic pain: Secondary | ICD-10-CM | POA: Insufficient documentation

## 2017-03-11 DIAGNOSIS — M6281 Muscle weakness (generalized): Secondary | ICD-10-CM | POA: Diagnosis present

## 2017-03-11 DIAGNOSIS — M5442 Lumbago with sciatica, left side: Secondary | ICD-10-CM | POA: Insufficient documentation

## 2017-03-11 DIAGNOSIS — M5441 Lumbago with sciatica, right side: Secondary | ICD-10-CM | POA: Diagnosis present

## 2017-03-11 NOTE — Therapy (Addendum)
[facility] [address] [place], [place], [address] Phone: [phone]   Fax:  [phone]  Physical Therapy Treatment  Patient Details  Name: Corey Mendoza MRN: [id number] Date of Birth: 1950-10-05 Referring Provider: Nino GlowMelam Khan MD   Encounter Date: 03/11/2017  PT End of Session - 03/11/17 1450    Visit Number  9    Number of Visits  13    Date for PT Re-Evaluation  03/13/17    PT Start Time  1345    PT Stop Time  1430    PT Time Calculation (min)  45 min    Activity Tolerance  Patient tolerated treatment well    Behavior During Therapy  [facility]/per[facility]       History reviewed. No pertinent past medical history.  History reviewed. No pertinent surgical history.  There were no vitals filed for this visit.  Subjective Assessment - 03/11/17 1400    Subjective  Patient reports the pain was 'terrible' during the vacation but reports after taking alieve and resting for 2 days. Patient reports the pain has significantly improved.     Pertinent History  History of LBP, HTN    Limitations  Standing;Walking    How long can you stand comfortably?  3 min    How long can you walk comfortably?  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  Yes    Pain Score  4     Pain Location  Calf    Pain Orientation  Right    Pain Descriptors / Indicators  Aching    Pain Onset  More than a month ago    Pain Frequency  Constant       Treatment    Manual Therapy:    STM utilizing superficial technique and tool assisted STM performed to patient's gastrocnemius/soleus complex with patient positioned in prone B to improve pain and spasms along the gastroc/soleus area.    Mobs: lumbar mobs L4-L5 grade II - x 30sec;    Therapeutic Exercise: Nerve glides in supine -- x 30 SLR  dorsiflexion Heel raises in standing - x 20; single leg stance heel raises - x 10  Hip abduction in standing - x 20 with RTB band around knees     Modalities (10min): with patient in prone   US with patient positioned in prone to improve pain and spasm using small sound head over the lumbar spine. Parameters 1 MHz, 2.2 W/cm2s, 10min, and a 100% duty cycle. Pt demonstrates decreased pain at end of session. Patient demonstrates decreased pain at the end of the session    PT Education - 03/11/17 1449    Education provided  Yes    Education Details  HEP: hip abduction with sit to stand    Person(s) Educated  Patient    Methods  Explanation;Demonstration    Comprehension  Verbalized understanding;Returned demonstration          PT Long Term Goals - 01/30/17 1239      PT LONG TERM GOAL #1   Title  Patient will be independent with HEP to continue benefits of therapy after discharge.    Baseline  Patient dependent with exercise performance/technique    Time  6    Period  Weeks    Status  New    Target Date  02/27/17      PT LONG TERM GOAL #2   Title  Patient will be able to stand for 1 hour without increase in pain to better be able to perform recreational and functional activities such as table tennis    Baseline  3 min     Time  6    Period  Weeks    Status  New    Target Date  03/13/17      PT LONG TERM GOAL #3   Title  Patient will be able to walk and workout without increase in pain to better be able to maintain function without increased pain.    Baseline  can walk 900 ft before requiring to sit down    Time  6    Period  Weeks    Status  New    Target Date  03/13/17            Plan - 03/11/17 1451    Clinical Impression Statement  Patient's pain is improving compared to previous sessions indicating previous carryover. Patient demonstrates decreased spasms and muscular guarding along his lower leg indicating further functional improvement. Although patient is  improving, he continues to have pain after performing walking for prolonged periods of time and patient will benefit from further skilled therapy to return to prior level of function.     Rehab Potential  Fair    Clinical Impairments Affecting Rehab Potential  (-) pain radiating down B LEs    PT Frequency  2x / week    PT Duration  6 weeks    PT Treatment/Interventions  Therapeutic activities;Therapeutic exercise;Moist Heat;Cryotherapy;Ultrasound;Electrical Stimulation;Iontophoresis 4mg /ml Dexamethasone;Neuromuscular re-education;Patient/family education;Passive range of motion;Manual techniques;Dry needling    PT Next Visit Plan  Progress positioning    PT Home Exercise Plan  See education    Consulted and Agree with Plan of Care  Patient       Patient will benefit from skilled therapeutic intervention in order to improve the following deficits and impairments:  Abnormal gait, Pain, Decreased mobility, Decreased range of motion, Decreased endurance, Decreased activity tolerance, Difficulty walking, Decreased balance, Increased muscle spasms  Visit Diagnosis: Muscle weakness (generalized)  Chronic bilateral low back pain with bilateral sciatica     Problem List There are no active problems to display for this patient.   [name], PT DPT 03/11/2017, 3:23 PM  [facility] SPORTS MEDICINE [address] [place], [address] Phone: [phone]   Fax:  [phone]  Name: Corey Mendoza MRN: [id number] Date of Birth: 01/30/1950

## 2017-03-13 ENCOUNTER — Ambulatory Visit: Payer: PRIVATE HEALTH INSURANCE

## 2017-03-13 DIAGNOSIS — M6281 Muscle weakness (generalized): Secondary | ICD-10-CM | POA: Diagnosis not present

## 2017-03-13 DIAGNOSIS — G8929 Other chronic pain: Secondary | ICD-10-CM

## 2017-03-13 DIAGNOSIS — M5441 Lumbago with sciatica, right side: Principal | ICD-10-CM

## 2017-03-13 DIAGNOSIS — M5442 Lumbago with sciatica, left side: Principal | ICD-10-CM

## 2017-03-13 NOTE — Therapy (Signed)
[facility] [address], [place], [address] Phone: [phone]   Fax:  [phone]  Physical Therapy Treatment  Patient Details  Name: Corey Mendoza MRN: [id number] Date of Birth: [birth year] Referring Provider: Nino Glow MD   Encounter Date: 03/13/2017  PT End of Session - 03/13/17 1440    Visit Number  10    Number of Visits  13    Date for PT Re-Evaluation  03/13/17    PT Start Time  1300    PT Stop Time  1345    PT Time Calculation (min)  45 min    Activity Tolerance  Patient tolerated treatment well    Behavior During Therapy  [facility] for tasks assessed/performed       History reviewed. No pertinent past medical history.  History reviewed. No pertinent surgical history.  There were no vitals filed for this visit.  Subjective Assessment - 03/13/17 1437    Subjective  Patient reports the pain demosntrates improvement in pain since the previous session and states he is overall over 60% better since the start of therapy.     Pertinent History  History of LBP, HTN    Limitations  Standing;Walking    How long can you stand comfortably?  3 min    How long can you walk comfortably?  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  Yes    Pain Score  3     Pain Location  Calf    Pain Orientation  Right    Pain Descriptors / Indicators  Aching    Pain Type  Chronic pain    Pain Onset  More than a month ago    Pain Frequency  Constant         Manual Therapy:    STM utilizing superficial technique and tool assisted STM performed to patient's gastrocnemius/soleus complex with patient positioned in prone B to improve pain and spasms along the gastroc/soleus area.    Mobs: lumbar mobs L4-L5 grade II - x 30sec;    Therapeutic Exercise: Nerve glides in supine -- x 30 SLR  dorsiflexion Heel raises in standing - 3 x 10; single leg stance heel raises - x 10  Hip abduction in standing - 3 x 5 with RTB band around knees  Ambulation on treadmill - x52min without any increase    Modalities ( ): with patient in prone   [place] with patient positioned in prone to improve pain and spasm using small sound head over the lumbar spine. Parameters 1 MHz, 2.2 W/cm2s, , and a 100% duty cycle. Pt demonstrates decreased pain at end of session. Patient demonstrates decreased pain at the end of the session     PT Education - 03/13/17 1439    Education provided  Yes    Education Details  Continue walking program    Person(s) Educated  Patient    Methods  Explanation;Demonstration    Comprehension  Verbalized understanding;Returned demonstration          PT Long Term Goals - 01/30/17 1239      PT LONG TERM GOAL #1   Title  Patient will be independent with HEP to continue benefits of therapy after discharge.    Baseline  Patient dependent with exercise performance/technique    Time  6    Period  Weeks    Status  New    Target Date  02/27/17      PT LONG TERM GOAL #2   Title  Patient will be able to stand for 1 hour without increase in pain to better be able to perform recreational and functional activities such as table tennis    Baseline  3 min     Time  6    Period  Weeks    Status  New    Target Date  03/13/17      PT LONG TERM GOAL #3   Title  Patient will be able to walk and workout without increase in pain to better be able to maintain function without increased pain.    Baseline  can walk 900 ft before requiring to sit down    Time  6    Period  Weeks    Status  New    Target Date  03/13/17            Plan - 03/13/17 1441    Clinical Impression Statement  Patient demonstrates improvement in pain and symptoms after performing STM to the R gastrocnemius indicating decreased spasms and pain. Patient demonstrates abillity to ambulate for 2 min  without increase in pain indicating functional improvement and carryover between sessions. Patient continues to have increased pain with long duration of standing. Patient will benefit from further skilled therapy to return to prior level of function.     Rehab Potential  Fair    Clinical Impairments Affecting Rehab Potential  (-) pain radiating down B LEs    PT Frequency  2x / week    PT Duration  6 weeks    PT Treatment/Interventions  Therapeutic activities;Therapeutic exercise;Moist Heat;Cryotherapy;Ultrasound;Electrical Stimulation;Iontophoresis 4mg /ml Dexamethasone;Neuromuscular re-education;Patient/family education;Passive range of motion;Manual techniques;Dry needling    PT Next Visit Plan  Progress positioning    PT Home Exercise Plan  See education    Consulted and Agree with Plan of Care  Patient       Patient will benefit from skilled therapeutic intervention in order to improve the following deficits and impairments:  Abnormal gait, Pain, Decreased mobility, Decreased range of motion, Decreased endurance, Decreased activity tolerance, Difficulty walking, Decreased balance, Increased muscle spasms  Visit Diagnosis: Chronic bilateral low back pain with bilateral sciatica  Muscle weakness (generalized)     Problem List There are no active problems to display for this patient.   Myrene GalasWesley Danajah Birdsell, PT DPT 03/13/2017, 2:44 PM  [facility] [address] [place], [place], [address] Phone: [phone]   Fax:  [phone]  Name: Corey Mendoza MRN: [id number] Date of Birth: 11/03/1950

## 2017-03-18 ENCOUNTER — Ambulatory Visit: Payer: PRIVATE HEALTH INSURANCE

## 2017-03-18 DIAGNOSIS — M5441 Lumbago with sciatica, right side: Secondary | ICD-10-CM

## 2017-03-18 DIAGNOSIS — M6281 Muscle weakness (generalized): Secondary | ICD-10-CM

## 2017-03-18 DIAGNOSIS — M5442 Lumbago with sciatica, left side: Secondary | ICD-10-CM

## 2017-03-18 DIAGNOSIS — G8929 Other chronic pain: Secondary | ICD-10-CM

## 2017-03-18 NOTE — Therapy (Signed)
[facility] [address] [place], [place], [address] Phone: [phone]   Fax:  [phone]  Physical Therapy Treatment  Patient Details  Name: Corey DameDevendra M. Joylene Mendoza MRN: [id number] Date of Birth: 1950-11-03 Referring Provider: Nino GlowMelam Khan MD   Encounter Date: 03/18/2017  PT End of Session - 03/18/17 1455    Visit Number  10    Number of Visits  13    Date for PT Re-Evaluation  04/15/17    PT Start Time  1300    PT Stop Time  1345    PT Time Calculation (min)  45 min    Activity Tolerance  Patient tolerated treatment well    Behavior During Therapy  [facility] assessed/performed       History reviewed. No pertinent past medical history.  History reviewed. No pertinent surgical history.  There were no vitals filed for this visit.  Subjective Assessment - 03/18/17 1452    Subjective  Patient reports the pain is unchaged since the previous session and reports the pain may be slightly worse. Patient states he has not been performing HEP but is overall improved compared to his initial visitation.     Pertinent History  History of LBP, HTN    Limitations  Standing;Walking    How long can you stand comfortably?  3 min    How long can you walk comfortably?  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  Yes    Pain Score  3     Pain Location  Calf    Pain Orientation  Right    Pain Descriptors / Indicators  Aching    Pain Type  Chronic pain    Pain Onset  More than a month ago    Pain Frequency  Constant        TREATMENT:   Manual Therapy:   STMutilizing superficial technique STM performed to patient's gastrocnemius/soleus complex with patient positioned in prone B to improve pain and spasms along the gastroc/soleus area.  Mobs: lumbar mobs L4-L5 grade III/IV - 2 x 30sec unilaterally on  the R; centrally;   Therapeutic Exercise: Nerve glides in supine -- 2 x 30 SLR dorsiflexion Hip abduction in sidelying - x30 with RTB band around knees  Ambulation around gym -- x1100 ft  Standing calf stretch -- 2 x 30  Modalities (8min): with patient in prone US with patient positioned in prone to improve pain and spasm using small sound head over the lumbar spine. Parameters 1 MHz,2.2 W/cm2s,628min, and a 100% duty cycle. Pt demonstrates decreased pain at end of session. Patient demonstrates decreased pain at the end of the session  Patient demonstrates decreased pain at end of session  PT Education - 03/18/17 1454    Education provided  Yes    Education Details  Continue walking program; standing calf stretch added to HEP    Person(s) Educated  Patient    Methods  Explanation;Demonstration    Comprehension  Verbalized understanding;Returned demonstration          PT Long Term Goals - 03/18/17 1455      PT LONG TERM GOAL #1   Title  Patient will be independent with HEP to continue benefits of therapy after discharge.    Baseline  Patient dependent with exercise performance/technique; moderate  cueing to perform HEP regularly    Time  6    Period  Weeks    Status  On-going      PT LONG TERM GOAL #2   Title  Patient will be able to stand for 1 hour without increase in pain to better be able to perform recreational and functional activities such as table tennis    Baseline  3 min; 03/18/2017: 5 min of standing    Time  6    Period  Weeks    Status  On-going      PT LONG TERM GOAL #3   Title  Patient will be able to walk and workout without increase in pain to better be able to maintain function without increased pain.    Baseline  can walk 900 ft before requiring to sit down; 03/18/2017: 1155ft    Time  6    Period  Weeks    Status  On-going            Plan - 03/18/17 1458    Clinical Impression Statement  Patient is making progress towards long term goals  with a greater ability to perform standing and walking exercises with less increase in pain. However, patient continues to have increased pain with standing activities and requires frequent sitting rest break when he needs to perform standing functional activities around the house. Patient demonstrates improvement in pain with STM to the calf and performing calf stretches. Patient will benefit from further skilled therapy to return to prior level of function.     Rehab Potential  Fair    Clinical Impairments Affecting Rehab Potential  (-) pain radiating down B LEs    PT Frequency  2x / week    PT Duration  6 weeks    PT Treatment/Interventions  Therapeutic activities;Therapeutic exercise;Moist Heat;Cryotherapy;Ultrasound;Electrical Stimulation;Iontophoresis 4mg /ml Dexamethasone;Neuromuscular re-education;Patient/family education;Passive range of motion;Manual techniques;Dry needling    PT Next Visit Plan  Progress positioning    PT Home Exercise Plan  See education    Consulted and Agree with Plan of Care  Patient       Patient will benefit from skilled therapeutic intervention in order to improve the following deficits and impairments:  Abnormal gait, Pain, Decreased mobility, Decreased range of motion, Decreased endurance, Decreased activity tolerance, Difficulty walking, Decreased balance, Increased muscle spasms  Visit Diagnosis: Muscle weakness (generalized)  Chronic bilateral low back pain with bilateral sciatica     Problem List There are no active problems to display for this patient.   [name], PT DPT 03/18/2017, 3:10 PM  [facility] PHYSICAL AND SPORTS MEDICINE [address] [place], [address] Phone: [phone]   Fax:  [phone]  Name: Corey Mendoza. [place] MRN: [id number] Date of Birth: [birth year]

## 2017-03-20 ENCOUNTER — Ambulatory Visit: Payer: PRIVATE HEALTH INSURANCE

## 2017-03-20 DIAGNOSIS — M6281 Muscle weakness (generalized): Secondary | ICD-10-CM

## 2017-03-20 DIAGNOSIS — M5441 Lumbago with sciatica, right side: Principal | ICD-10-CM

## 2017-03-20 DIAGNOSIS — M5442 Lumbago with sciatica, left side: Principal | ICD-10-CM

## 2017-03-20 DIAGNOSIS — G8929 Other chronic pain: Secondary | ICD-10-CM

## 2017-03-20 NOTE — Therapy (Signed)
[facility] PHYSICAL AND SPORTS MEDICINE [address] , [place], [address] Phone: [phone]   Fax:  [phone]  Physical Therapy Treatment  Patient Details  Name: Corey DameDevendra M. [name] MRN: [id number] Date of Birth: Sep 10, 1950 Referring Provider: Nino GlowMelam Khan MD   Encounter Date: 03/20/2017  PT End of Session - 03/20/17 1502    Visit Number  11    Number of Visits  21    Date for PT Re-Evaluation  04/15/17    PT Start Time  1300    PT Stop Time  1345    PT Time Calculation (min)  45 min    Activity Tolerance  Patient tolerated treatment well    Behavior During Therapy  [facility] for tasks assessed/performed       History reviewed. No pertinent past medical history.  History reviewed. No pertinent surgical history.  There were no vitals filed for this visit.  Subjective Assessment - 03/20/17 1459    Subjective  Patient reports the pain has improved since the previous session and has overall decreased pain compared to previous session. Patient reports he has an appointment with his MD on Monday.     Pertinent History  History of LBP, HTN    Limitations  Standing;Walking    How long can you stand comfortably?  3 min    How long can you walk comfortably?  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  Yes    Pain Score  3     Pain Location  Calf    Pain Orientation  Right    Pain Descriptors / Indicators  Aching    Pain Radiating Towards  into the bottom of the foot    Pain Onset  More than a month ago    Pain Frequency  Intermittent       TREATMENT:  Manual Therapy:   STMutilizing superficial technique STM performed to patient's gastrocnemius/soleus, hamstring, and glutes complex with patient positioned in prone B to improve pain and spasms along the gastroc/soleus area.  Mobs: lumbar mobs L4-L5 grade III/IV - 2 x  30sec unilaterally on the R;   Therapeutic Exercise: Nerve glides in supine -- 2 x 30 SLR dorsiflexion Ambulation around gym backwards -- x340 ft  Standing calf stretch -- 2 x 30  Modalities: with patient in prone US with patient positioned in prone to improve pain and spasm using small sound head over the lumbar spine. Parameters 1 MHz,2.2 W/cm2s,338min, and a 100% duty cycle. Pt demonstrates decreased pain at end of session. Patient demonstrates decreased pain at the end of the session  Patient demonstrates decreased pain at end of session   PT Education - 03/20/17 1502    Education provided  Yes    Education Details  form/technique with exercise    Person(s) Educated  Patient    Methods  Explanation;Demonstration    Comprehension  Verbalized understanding;Returned demonstration          PT Long Term Goals - 03/18/17 1455      PT LONG TERM GOAL #1   Title  Patient will be independent with HEP to continue benefits of therapy after discharge.    Baseline  Patient dependent with exercise performance/technique; moderate cueing to perform HEP regularly    Time  6    Period  Weeks  Status  On-going      PT LONG TERM GOAL #2   Title  Patient will be able to stand for 1 hour without increase in pain to better be able to perform recreational and functional activities such as table tennis    Baseline  3 min; 03/18/2017: 5 min of standing    Time  6    Period  Weeks    Status  On-going      PT LONG TERM GOAL #3   Title  Patient will be able to walk and workout without increase in pain to better be able to maintain function without increased pain.    Baseline  can walk 900 ft before requiring to sit down; 03/18/2017: 1167ft    Time  6    Period  Weeks    Status  On-going            Plan - 03/20/17 1502    Clinical Impression Statement  Patient demonstrates improvement with demonstrating less pain with exercises compared to the previous session. Patient  demonstrates increased pain with walking backwards versus forwards indicating increased difficulty and pain with performing hip and lumbar extension. Focused on performing manual therapy along the muscular of the posterior hip/leg to improve mobility of the affected nerve. Patient will benefit from further skilled therapy to return to prior level of function.     Rehab Potential  Fair    Clinical Impairments Affecting Rehab Potential  (-) pain radiating down B LEs    PT Frequency  2x / week    PT Duration  6 weeks    PT Treatment/Interventions  Therapeutic activities;Therapeutic exercise;Moist Heat;Cryotherapy;Ultrasound;Electrical Stimulation;Iontophoresis 4mg /ml Dexamethasone;Neuromuscular re-education;Patient/family education;Passive range of motion;Manual techniques;Dry needling    PT Next Visit Plan  Progress positioning    PT Home Exercise Plan  See education    Consulted and Agree with Plan of Care  Patient       Patient will benefit from skilled therapeutic intervention in order to improve the following deficits and impairments:  Abnormal gait, Pain, Decreased mobility, Decreased range of motion, Decreased endurance, Decreased activity tolerance, Difficulty walking, Decreased balance, Increased muscle spasms  Visit Diagnosis: Chronic bilateral low back pain with bilateral sciatica  Muscle weakness (generalized)     Problem List There are no active problems to display for this patient.   [name], PT DPT 03/20/2017, 3:07 PM  [facility] PHYSICAL AND SPORTS MEDICINE [address] [place], [address] Phone: [phone]   Fax:  [phone]  Name: Corey Lipscomb. Mendoza MRN: [id number] Date of Birth: [birth year]

## 2017-04-04 ENCOUNTER — Ambulatory Visit: Payer: PRIVATE HEALTH INSURANCE | Attending: Internal Medicine

## 2017-04-04 ENCOUNTER — Other Ambulatory Visit: Payer: Self-pay | Admitting: Neurology

## 2017-04-04 DIAGNOSIS — G8929 Other chronic pain: Secondary | ICD-10-CM | POA: Diagnosis present

## 2017-04-04 DIAGNOSIS — M5442 Lumbago with sciatica, left side: Secondary | ICD-10-CM | POA: Insufficient documentation

## 2017-04-04 DIAGNOSIS — R202 Paresthesia of skin: Secondary | ICD-10-CM

## 2017-04-04 DIAGNOSIS — M5441 Lumbago with sciatica, right side: Secondary | ICD-10-CM | POA: Insufficient documentation

## 2017-04-04 DIAGNOSIS — M6281 Muscle weakness (generalized): Secondary | ICD-10-CM | POA: Diagnosis not present

## 2017-04-04 NOTE — Therapy (Signed)
[facility] PH[facility]ORTS MEDICINE [address], [place], [address] Phone: [phone]   Fax:  [phone]  Physical Therapy Treatment  Patient Details  Name: Corey Mendoza MRN: [id number] Date of Birth: 09-May-1950 Referring Provider: Nino Glow MD   Encounter Date: 04/04/2017  PT End of Session - 04/04/17 1230    Visit Number  12    Number of Visits  21    Date for PT Re-Evaluation  04/15/17    PT Start Time  1115    PT Stop Time  1200    PT Time Calculation (min)  45 min    Activity Tolerance  Patient tolerated treatment well    Behavior During Therapy  [facility] for tasks assessed/performed       History reviewed. No pertinent past medical history.  History reviewed. No pertinent surgical history.  There were no vitals filed for this visit.  Subjective Assessment - 04/04/17 1120    Subjective  Patient reports the pain has improved since the previous session and has overall decreased pain compared to previous session. Patient reports he has an appointment with his MD on Monday.     Pertinent History  History of LBP, HTN    Limitations  Standing;Walking    How long can you stand comfortably?  3 min    How long can you walk comfortably?  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  Yes    Pain Score  3     Pain Location  Calf    Pain Orientation  Right    Pain Descriptors / Indicators  Aching    Pain Type  Chronic pain    Pain Onset  More than a month ago    Pain Frequency  Intermittent       TREATMENT: Manual Therapy:    STM utilizing superficial technique STM performed to patient's gastrocnemius/soleus, hamstring, and glutes complex with patient positioned in prone B to improve pain and spasms along the gastroc/soleus area.    Mobs: lumbar mobs L4-L5 grade III/IV - 2 x 30sec unilaterally on the R;      Modalities: with patient in prone   [place] with patient positioned in prone to improve pain and spasm using small sound head over the lumbar spine. Parameters 1 MHz, 2.2 W/cm2s, , and a 100% duty cycle. Pt demonstrates decreased pain at end of session. Patient demonstrates decreased pain at the end of the session    Patient demonstrates decreased pain at end of session    PT Education - 04/04/17 1230    Education provided  Yes    Education Details  form/technique with exercise    Person(s) Educated  Patient    Methods  Explanation;Demonstration    Comprehension  Verbalized understanding;Returned demonstration          PT Long Term Goals - 03/18/17 1455      PT LONG TERM GOAL #1   Title  Patient will be independent with HEP to continue benefits of therapy after discharge.    Baseline  Patient dependent with exercise performance/technique; moderate cueing to perform HEP regularly    Time  6    Period  Weeks    Status  On-going      PT LONG TERM GOAL #2   Title  Patient  will be able to stand for 1 hour without increase in pain to better be able to perform recreational and functional activities such as table tennis    Baseline  3 min; 03/18/2017: 5 min of standing    Time  6    Period  Weeks    Status  On-going      PT LONG TERM GOAL #3   Title  Patient will be able to walk and workout without increase in pain to better be able to maintain function without increased pain.    Baseline  can walk 900 ft before requiring to sit down; 03/18/2017: 117300ft    Time  6    Period  Weeks    Status  On-going            Plan - 04/04/17 1231    Clinical Impression Statement  Patient demonstrates decreased pain and spasms after performing manual therapy and modalities treatment session. Patient reports his pain is improving after stopping drinking. Patient demonstrates decreased pain and spasms and will benefit from further skilled therapy to return to pror level of function.      Rehab Potential  Fair    Clinical Impairments Affecting Rehab Potential  (-) pain radiating down B LEs    PT Frequency  2x / week    PT Duration  6 weeks    PT Treatment/Interventions  Therapeutic activities;Therapeutic exercise;Moist Heat;Cryotherapy;Ultrasound;Electrical Stimulation;Iontophoresis 4mg /ml Dexamethasone;Neuromuscular re-education;Patient/family education;Passive range of motion;Manual techniques;Dry needling    PT Next Visit Plan  Progress positioning    PT Home Exercise Plan  See education    Consulted and Agree with Plan of Care  Patient       Patient will benefit from skilled therapeutic intervention in order to improve the following deficits and impairments:  Abnormal gait, Pain, Decreased mobility, Decreased range of motion, Decreased endurance, Decreased activity tolerance, Difficulty walking, Decreased balance, Increased muscle spasms  Visit Diagnosis: Muscle weakness (generalized)  Chronic bilateral low back pain with bilateral sciatica     Problem List There are no active problems to display for this patient.   Myrene GalasWesley Rosaisela Jamroz, PT DPT 04/04/2017, 12:36 PM  [facility] [address] [place], [place], [address] Phone: [phone]   Fax:  [phone]  Name: Corey Mendoza MRN: [id number] Date of Birth: [birth year]

## 2017-04-08 ENCOUNTER — Ambulatory Visit: Payer: PRIVATE HEALTH INSURANCE

## 2017-04-11 ENCOUNTER — Ambulatory Visit
Admission: RE | Admit: 2017-04-11 | Discharge: 2017-04-11 | Disposition: A | Discharge status: Home / Self Care | Payer: PRIVATE HEALTH INSURANCE | Source: Ambulatory Visit | Attending: Neurology | Admitting: Neurology

## 2017-04-11 DIAGNOSIS — R202 Paresthesia of skin: Secondary | ICD-10-CM

## 2017-04-11 DIAGNOSIS — M5126 Other intervertebral disc displacement, lumbar region: Secondary | ICD-10-CM | POA: Insufficient documentation

## 2017-04-11 DIAGNOSIS — R29898 Other symptoms and signs involving the musculoskeletal system: Secondary | ICD-10-CM | POA: Diagnosis present

## 2017-04-11 DIAGNOSIS — M48061 Spinal stenosis, lumbar region without neurogenic claudication: Secondary | ICD-10-CM | POA: Insufficient documentation

## 2017-10-23 ENCOUNTER — Other Ambulatory Visit: Payer: Self-pay | Admitting: Pulmonary Disease

## 2017-10-23 DIAGNOSIS — R058 Other specified cough: Secondary | ICD-10-CM

## 2017-10-23 DIAGNOSIS — R05 Cough: Secondary | ICD-10-CM

## 2017-10-23 DIAGNOSIS — Z01818 Encounter for other preprocedural examination: Secondary | ICD-10-CM

## 2017-10-23 DIAGNOSIS — J9819 Other pulmonary collapse: Secondary | ICD-10-CM

## 2017-10-28 ENCOUNTER — Ambulatory Visit
Admission: RE | Admit: 2017-10-28 | Discharge: 2017-10-28 | Disposition: A | Discharge status: Home / Self Care | Payer: Medicare Other | Source: Ambulatory Visit | Attending: Pulmonary Disease | Admitting: Pulmonary Disease

## 2017-10-28 ENCOUNTER — Ambulatory Visit: Payer: PRIVATE HEALTH INSURANCE

## 2017-10-30 ENCOUNTER — Ambulatory Visit
Admission: RE | Admit: 2017-10-30 | Discharge: 2017-10-30 | Disposition: A | Discharge status: Home / Self Care | Payer: Medicare Other | Source: Ambulatory Visit | Attending: Pulmonary Disease | Admitting: Pulmonary Disease

## 2017-10-30 DIAGNOSIS — J9819 Other pulmonary collapse: Secondary | ICD-10-CM

## 2017-10-30 DIAGNOSIS — R918 Other nonspecific abnormal finding of lung field: Secondary | ICD-10-CM | POA: Insufficient documentation

## 2017-10-30 DIAGNOSIS — R05 Cough: Secondary | ICD-10-CM | POA: Insufficient documentation

## 2017-10-30 DIAGNOSIS — J9811 Atelectasis: Secondary | ICD-10-CM | POA: Insufficient documentation

## 2017-10-30 DIAGNOSIS — J984 Other disorders of lung: Secondary | ICD-10-CM | POA: Insufficient documentation

## 2017-10-30 DIAGNOSIS — Z01818 Encounter for other preprocedural examination: Secondary | ICD-10-CM | POA: Diagnosis not present

## 2017-10-30 DIAGNOSIS — R058 Other specified cough: Secondary | ICD-10-CM

## 2017-10-30 DIAGNOSIS — I700 Atherosclerosis of aorta: Secondary | ICD-10-CM | POA: Insufficient documentation

## 2017-10-30 HISTORY — DX: Essential (primary) hypertension: I10

## 2017-10-30 MED ORDER — IOPAMIDOL (ISOVUE-300) INJECTION 61%
75.0000 mL | Freq: Once | INTRAVENOUS | Status: AC | PRN
  Administered 2017-10-30: 75 mL via INTRAVENOUS

## 2017-10-31 ENCOUNTER — Ambulatory Visit: Payer: Medicare Other

## 2019-02-25 IMAGING — MR MR LUMBAR SPINE W/O CM
5 series · 35 of 48 positions shown · non-contrast
Comparison: None.

CLINICAL DATA: 66 y/o M; intermittent lower back pain for years.
Right-greater-than-left bilateral calf pain.

EXAM:
MRI LUMBAR SPINE WITHOUT CONTRAST
TECHNIQUE: Multiplanar, multisequence MR imaging of the lumbar spine was
performed. No intravenous contrast was administered.

[Series 2: T2 · sagittal · 4.0mm · 0.81mm/px · 6 of 17 slices shown (1 of 2)]
[im 1/17]
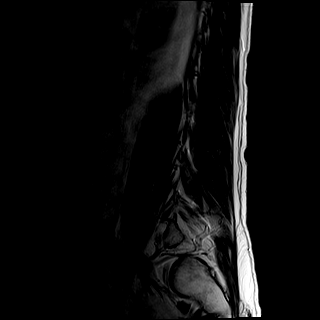
[im 4/17]
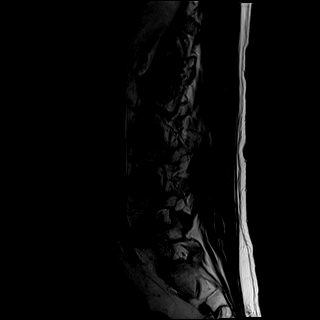
[im 7/17]
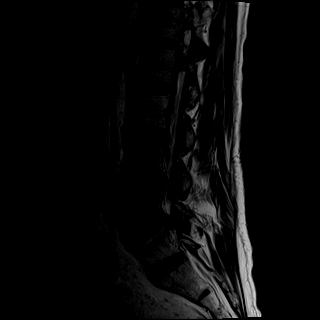
[im 10/17]
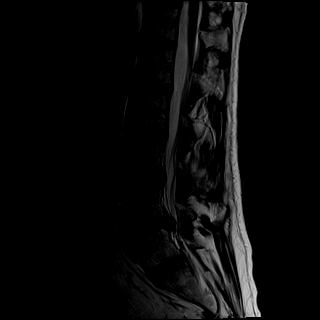
[im 13/17]
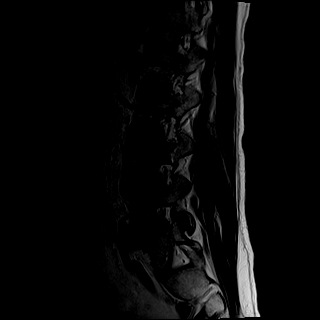
[im 17/17]
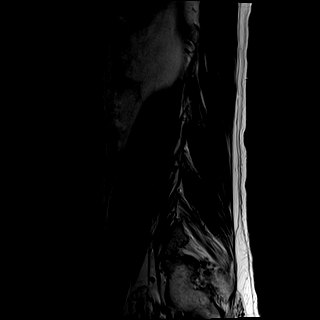

[Series 3: T1 · sagittal · 4.0mm · 0.81mm/px · 6 of 17 slices shown (1 of 2)]
[im 1/17]
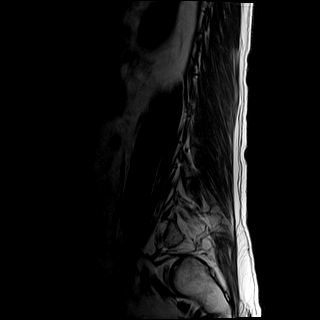
[im 4/17]
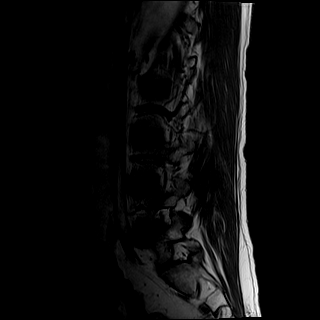
[im 7/17]
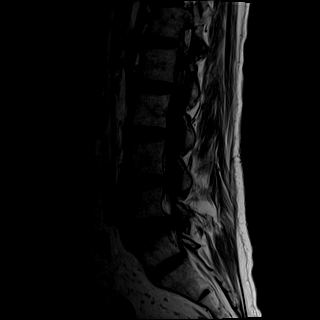
[im 10/17]
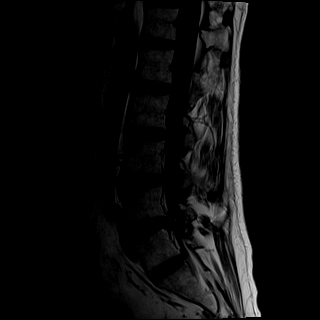
[im 13/17]
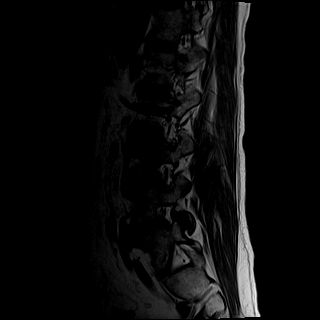
[im 17/17]
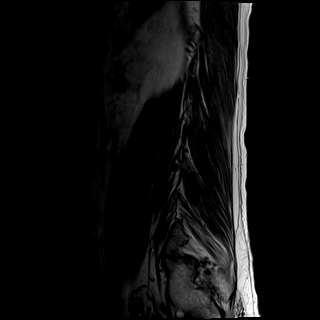

[Series 4: STIR · sagittal · 4.0mm · 0.81mm/px · 5 of 17 slices shown]
[im 1/17]
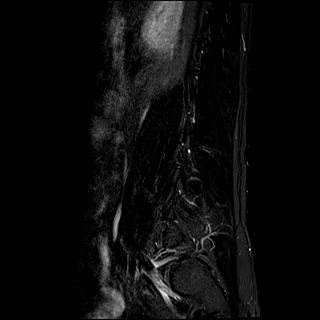
[im 4/17]
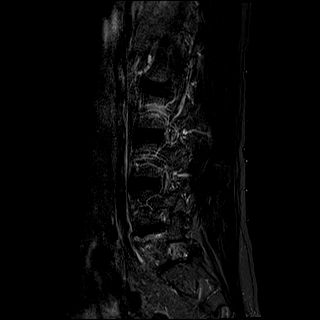
[im 7/17]
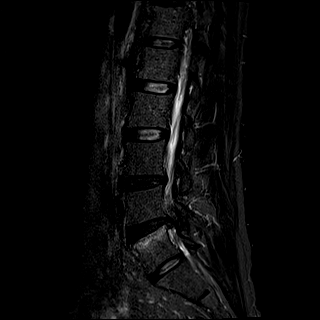
[im 10/17]
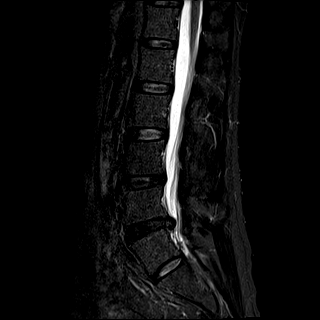
[im 13/17]
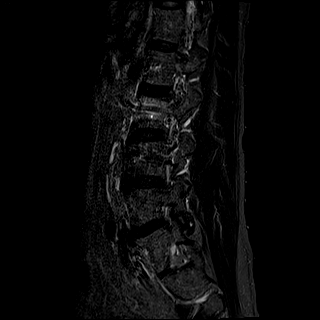

[Series 5: T2 · axial · 4.0mm · 0.78mm/px · z∈[-131,+101]mm · 9 of 42 slices shown (2 of 2)]
[im 1/42]
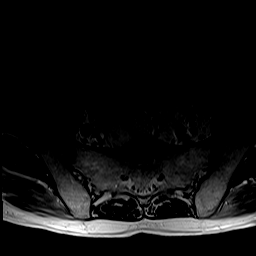
[im 6/42]
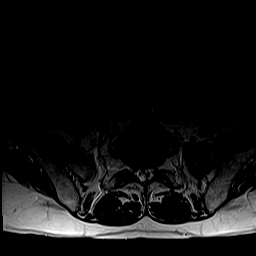
[im 12/42]
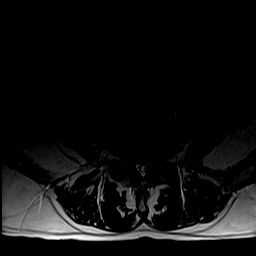
[im 18/42]
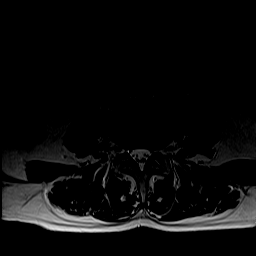
[im 21/42]
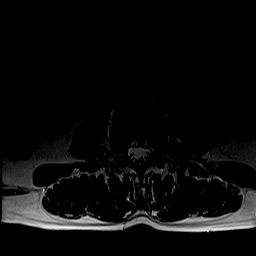
[im 24/42]
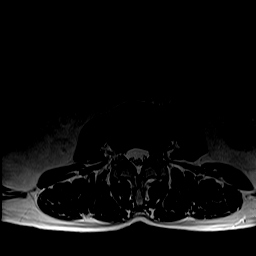
[im 30/42]
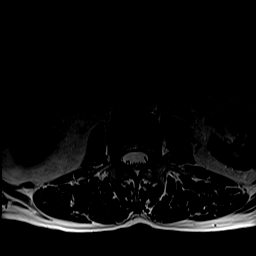
[im 36/42]
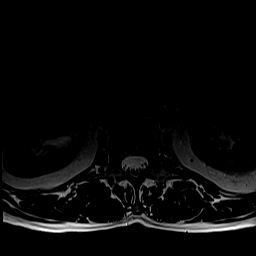
[im 42/42]
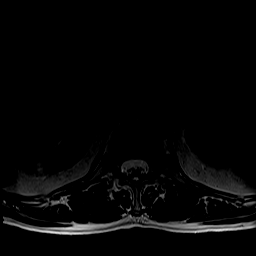

[Series 6: T1 · axial · 4.0mm · 0.39mm/px · z∈[-131,+101]mm · 9 of 42 slices shown (2 of 2)]
[im 1/42]
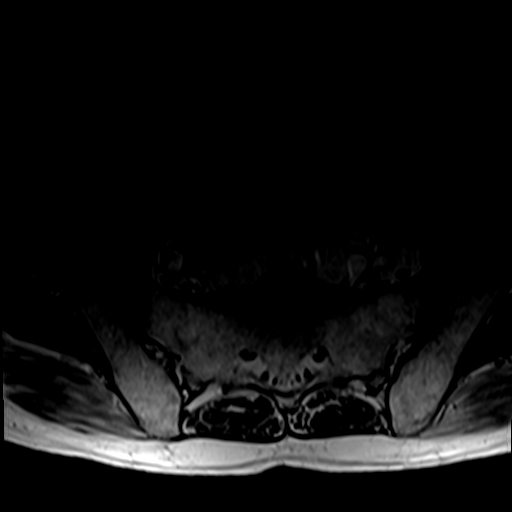
[im 6/42]
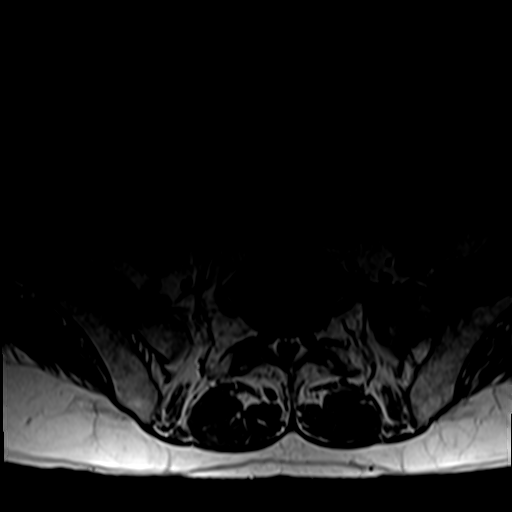
[im 12/42]
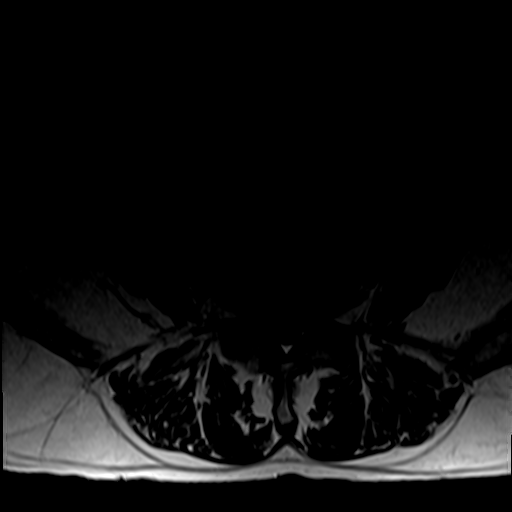
[im 18/42]
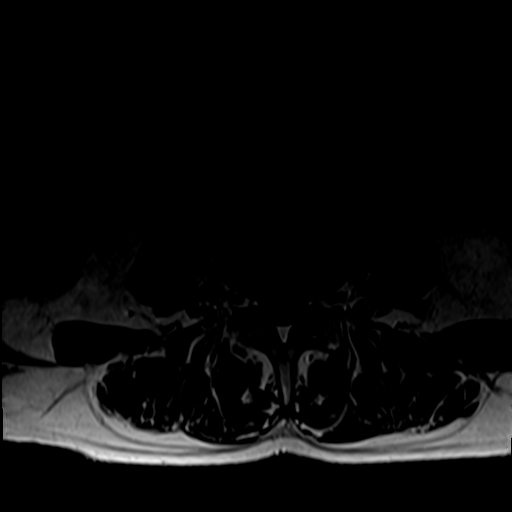
[im 21/42]
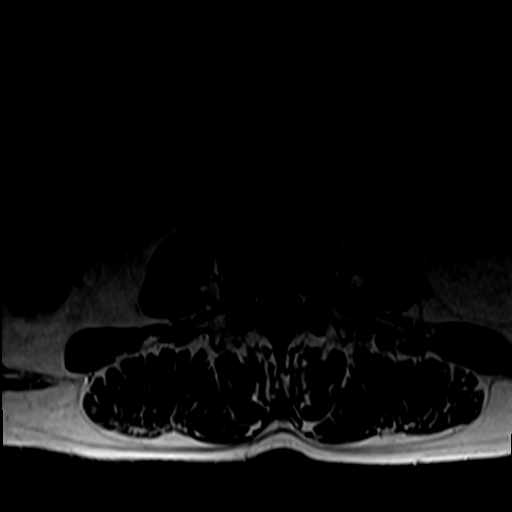
[im 24/42]
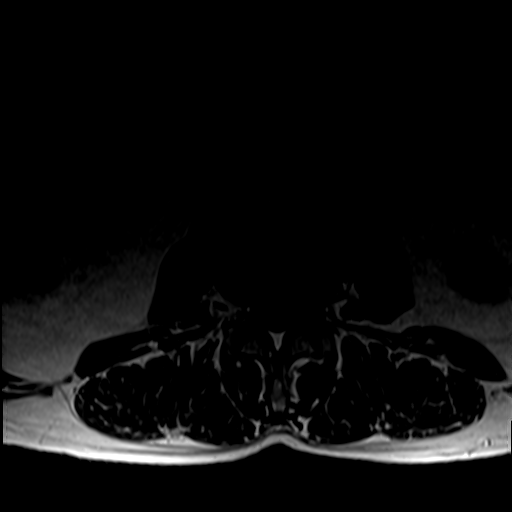
[im 30/42]
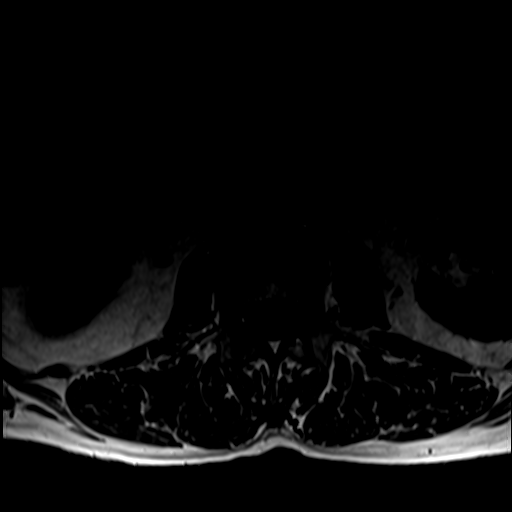
[im 36/42]
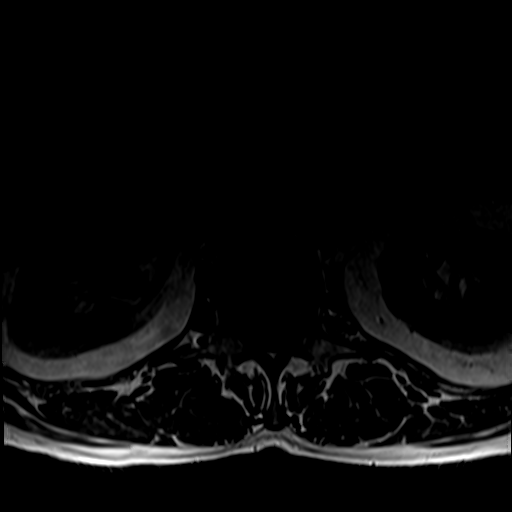
[im 42/42]
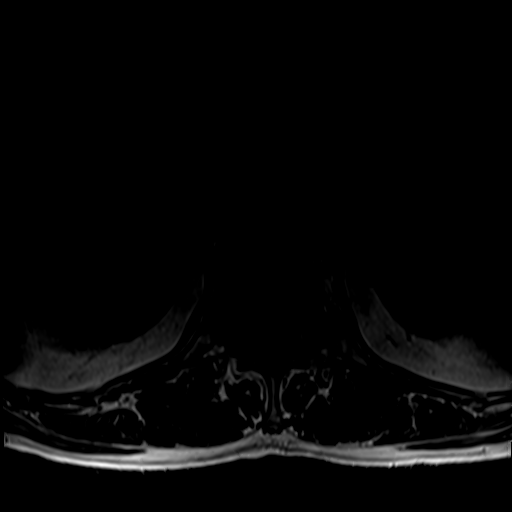

[35 of 48 positions shown; findings below may reference images not displayed]

FINDINGS: Segmentation: Sacralization of L5 vertebral body with articulation
of left transverse process to sacral ala.

Alignment:  Physiologic.

Vertebrae: No fracture, evidence of discitis, or bone lesion. Mild
degenerative endplate edema within the right aspect of superior L5
endplate.

Conus medullaris and cauda equina: Conus extends to the L1 level.
Conus and cauda equina appear normal.

Paraspinal and other soft tissues: Negative.

Disc levels:

L1-2: No significant disc displacement, foraminal stenosis, or canal
stenosis.

L2-3: No significant disc displacement, foraminal stenosis, or canal
stenosis.

L3-4: Small disc bulge. No significant foraminal or canal stenosis.

L4-5: Moderate central and subarticular disc protrusion eccentric to
the right. Mild facet hypertrophy. Stenosis of lateral recesses with
disc contact on the descending L5 nerve roots greater on the right.
Moderate canal stenosis. No significant foraminal stenosis.

L5-S1: No significant disc displacement, foraminal stenosis, or
canal stenosis.
IMPRESSION: L4-5 moderate central and subarticular protrusion eccentric to the
right, stenosis of lateral recesses with disc contact on descending
L5 nerve roots greater on the right, and moderate canal stenosis.
Otherwise unremarkable lumbar spine MRI.

By: Kwong Chi Magaoay M.D.

## 2021-08-24 LAB — COLOGUARD: COLOGUARD: NEGATIVE

## 2021-08-28 ENCOUNTER — Other Ambulatory Visit: Payer: Self-pay | Admitting: Internal Medicine

## 2021-08-28 DIAGNOSIS — R079 Chest pain, unspecified: Secondary | ICD-10-CM

## 2021-08-28 DIAGNOSIS — I208 Other forms of angina pectoris: Secondary | ICD-10-CM

## 2021-09-20 ENCOUNTER — Telehealth (HOSPITAL_COMMUNITY): Payer: Self-pay | Admitting: Emergency Medicine

## 2021-09-20 DIAGNOSIS — R079 Chest pain, unspecified: Secondary | ICD-10-CM

## 2021-09-20 MED ORDER — IVABRADINE HCL 5 MG PO TABS: 15.0000 mg | ORAL_TABLET | Freq: Once | ORAL | 0 refills | Status: AC

## 2021-09-20 NOTE — Telephone Encounter (Signed)
Attempted to call patient regarding upcoming cardiac CT appointment. °Left message on voicemail with name and callback number °Danesha Kirchoff RN Navigator Cardiac Imaging °[facility] °[phone] Office °[phone] Cell ° °

## 2021-09-20 NOTE — Telephone Encounter (Signed)
Reaching out to patient to offer assistance regarding upcoming cardiac imaging study; pt verbalizes understanding of appt date/time, parking situation and where to check in, pre-test NPO status and medications ordered, and verified current allergies; name and call back number provided for further questions should they arise Corey Bond RN Navigator Cardiac Imaging [facility] [phone] office [phone] cell  Corey Mendoza Denies iv issues Picking up 15mg  ivabradine from pharm on file, aware to take 2 hr prior to scan

## 2021-09-21 ENCOUNTER — Ambulatory Visit
Admission: RE | Admit: 2021-09-21 | Discharge: 2021-09-21 | Disposition: A | Discharge status: Home / Self Care | Payer: Medicare Other | Source: Ambulatory Visit | Attending: Internal Medicine | Admitting: Internal Medicine

## 2021-09-21 DIAGNOSIS — I208 Other forms of angina pectoris: Secondary | ICD-10-CM | POA: Insufficient documentation

## 2021-09-21 DIAGNOSIS — R079 Chest pain, unspecified: Secondary | ICD-10-CM | POA: Diagnosis present

## 2021-09-21 LAB — POCT I-STAT CREATININE: Creatinine, Ser: 1 mg/dL (ref 0.61–1.24)

## 2021-09-21 MED ORDER — NITROGLYCERIN 0.4 MG SL SUBL
0.8000 mg | SUBLINGUAL_TABLET | Freq: Once | SUBLINGUAL | Status: AC
  Administered 2021-09-21: 0.8 mg via SUBLINGUAL

## 2021-09-21 MED ORDER — IOHEXOL 350 MG/ML SOLN
75.0000 mL | Freq: Once | INTRAVENOUS | Status: AC | PRN
  Administered 2021-09-21: 75 mL via INTRAVENOUS

## 2021-09-21 MED ORDER — METOPROLOL TARTRATE 5 MG/5ML IV SOLN
10.0000 mg | Freq: Once | INTRAVENOUS | Status: AC
  Administered 2021-09-21: 10 mg via INTRAVENOUS

## 2021-09-21 NOTE — Progress Notes (Signed)
Patient tolerated procedure well. Ambulate w/o difficulty. Denies light headedness or being dizzy. Sitting in chair drinking water provided. Encouraged to drink extra water today and reasoning explained. Verbalized understanding. All questions answered. ABC intact. No further needs. Discharge from procedure area w/o issues.   °

## 2021-11-27 ENCOUNTER — Ambulatory Visit: Payer: Medicare Other | Admitting: Podiatry

## 2022-06-06 ENCOUNTER — Other Ambulatory Visit: Payer: Self-pay

## 2022-06-06 DIAGNOSIS — F1090 Alcohol use, unspecified, uncomplicated: Secondary | ICD-10-CM

## 2022-06-06 DIAGNOSIS — R600 Localized edema: Secondary | ICD-10-CM

## 2022-06-14 ENCOUNTER — Other Ambulatory Visit: Payer: Medicare Other

## 2022-06-21 ENCOUNTER — Ambulatory Visit: Payer: Medicare Other

## 2022-06-28 ENCOUNTER — Ambulatory Visit
Admission: RE | Admit: 2022-06-28 | Discharge: 2022-06-28 | Disposition: A | Discharge status: Home / Self Care | Payer: Medicare Other | Source: Ambulatory Visit | Attending: Internal Medicine | Admitting: Internal Medicine

## 2022-06-28 DIAGNOSIS — F1090 Alcohol use, unspecified, uncomplicated: Secondary | ICD-10-CM | POA: Insufficient documentation

## 2022-06-28 DIAGNOSIS — R600 Localized edema: Secondary | ICD-10-CM | POA: Diagnosis present

## 2023-12-15 ENCOUNTER — Other Ambulatory Visit: Payer: Self-pay

## 2023-12-15 ENCOUNTER — Emergency Department

## 2023-12-15 ENCOUNTER — Emergency Department: Admission: EM | Admit: 2023-12-15 | Discharge: 2023-12-15 | Disposition: A | Discharge status: Home / Self Care

## 2023-12-15 DIAGNOSIS — R079 Chest pain, unspecified: Secondary | ICD-10-CM | POA: Diagnosis present

## 2023-12-15 DIAGNOSIS — I160 Hypertensive urgency: Secondary | ICD-10-CM | POA: Diagnosis not present

## 2023-12-15 DIAGNOSIS — I10 Essential (primary) hypertension: Secondary | ICD-10-CM | POA: Insufficient documentation

## 2023-12-15 DIAGNOSIS — Z79899 Other long term (current) drug therapy: Secondary | ICD-10-CM | POA: Diagnosis not present

## 2023-12-15 LAB — BASIC METABOLIC PANEL WITH GFR
Anion gap: 11 (ref 5–15)
BUN: 8 mg/dL (ref 8–23)
CO2: 26 mmol/L (ref 22–32)
Calcium: 10 mg/dL (ref 8.9–10.3)
Chloride: 103 mmol/L (ref 98–111)
Creatinine, Ser: 0.91 mg/dL (ref 0.61–1.24)
GFR, Estimated: >60 mL/min (ref >60)
Glucose, Bld: 143 mg/dL — ABNORMAL HIGH (ref 70–99)
Potassium: 3.8 mmol/L (ref 3.5–5.1)
Sodium: 140 mmol/L (ref 135–145)

## 2023-12-15 LAB — CBC
HCT: 44.1 % (ref 39.0–52.0)
Hemoglobin: 14.5 g/dL (ref 13.0–17.0)
MCH: 29.4 pg (ref 26.0–34.0)
MCHC: 32.9 g/dL (ref 30.0–36.0)
MCV: 89.5 fL (ref 80.0–100.0)
Platelets: 200 K/uL (ref 150–400)
RBC: 4.93 MIL/uL (ref 4.22–5.81)
RDW: 11.9 % (ref 11.5–15.5)
WBC: 5.8 K/uL (ref 4.0–10.5)
nRBC: 0 % (ref 0.0–0.2)

## 2023-12-15 LAB — TROPONIN T, HIGH SENSITIVITY
Troponin T High Sensitivity: <15 ng/L (ref 0–19)
Troponin T High Sensitivity: <15 ng/L (ref 0–19)

## 2023-12-15 MED ORDER — AMLODIPINE BESYLATE 5 MG PO TABS: 5.0000 mg | ORAL_TABLET | Freq: Every day | ORAL | 0 refills | Status: AC

## 2023-12-15 NOTE — ED Triage Notes (Addendum)
 Pt to ED for L chest pressure since this morning. Pt asking for water several times, told him not yet. States his son is an designer, industrial/product at [employer]. Respirations are unlabored, skin dry.

## 2023-12-15 NOTE — ED Provider Notes (Signed)
 [facility] Provider Note    Event Date/Time   First MD Initiated Contact with Patient 12/15/23 1619     (approximate)   History   Chest Pain   HPI  Corey Mendoza is a 73 y.o. male who presents with concern of chest pain.  Apparently developed chest pain earlier today, has been dealing with off-and-on symptoms of chest pain seem to last about 20 to 30 minutes at a time and improve with the rest.  When he noticed the symptoms he also checked his blood pressure and noted to be in the 170s to 180s range.  His son who is an anesthesiologist recommended that he take one of his old tablets of amlodipine  to help manage his blood pressure.  He took the amlodipine  prior to arrival he has had a gradual increase in his losartan as well over the last few years going from 25 to 50-75 cannot take in the 100 daily.  Currently his symptoms have resolved, denying any chest pain shortness of breath numbness tingling weakness or visual changes.  Apparently a few days ago he also had some blurry vision in his left eye but this lasted for about 10 minutes and then resolved.  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  He is not able to quantify how much alcohol is in each pack.  He did have a CT coronary done in 2023 which I reviewed, no acute findings were noted at that time it seems.     Physical Exam   Triage Vital Signs: ED Triage Vitals  Encounter Vitals Group     BP 12/15/23 1304 (!) 156/94     Girls Systolic BP Percentile --      Girls Diastolic BP Percentile --      Boys Systolic BP Percentile --      Boys Diastolic BP Percentile --      Pulse Rate 12/15/23 1304 100     Resp 12/15/23 1304 20     Temp 12/15/23 1304 98 F (36.7 C)     Temp Source 12/15/23 1304 Oral     SpO2 12/15/23 1304 99 %     Weight 12/15/23 1305 165 lb (74.8 kg)     Height 12/15/23 1305 6' (1.829 m)     Head Circumference --       Peak Flow --      Pain Score 12/15/23 1301 3     Pain Loc --      Pain Education --      Exclude from Growth Chart --     Most recent vital signs: Vitals:   12/15/23 1304 12/15/23 1637  BP: (!) 156/94   Pulse: 100   Resp: 20   Temp: 98 F (36.7 C)   SpO2: 99% 99%     General: Awake, no distress.  CV:  Good peripheral perfusion.  Resp:  Normal effort.  Abd:  No distention.  Neuro:  Cranial nerves II to XII are intact, appropriate sensation in the upper and lower extremities, normal coordination appropriate strength, NIH was 0 Other:     ED Results / Procedures / Treatments   Labs (all labs ordered are listed, but only abnormal results are displayed) Labs Reviewed  BASIC METABOLIC PANEL WITH GFR - Abnormal; Notable for the following components:      Result Value   Glucose, Bld 143 (*)    All  other components within normal limits  CBC  TROPONIN T, HIGH SENSITIVITY  TROPONIN T, HIGH SENSITIVITY     EKG  Appears to be sinus rhythm with rate of about 95, axis of 105, intervals appear to be within normal limits, there are some slight depressions in leads V5 and V6, but these are nonspecific findings, no prior EKGs available for comparison   RADIOLOGY No acute cardiopulmonary finding  PROCEDURES:  Critical Care performed: No  Procedures   MEDICATIONS ORDERED IN ED: Medications - No data to display   IMPRESSION / MDM / ASSESSMENT AND PLAN / ED COURSE  I reviewed the triage vital signs and the nursing notes.                               Patient's presentation is most consistent with acute complicated illness / injury requiring diagnostic workup.  73 year old male who presents today with concern of hypertension and chest pain.  His pain is concerning for anginal symptoms of chest pain given the intermittent nature but seems to be improved with rest.  Also found to be hypertensive at home despite antihypertensive therapy.  Troponin here is fortunately  reassuring, and EKG does not show acute ischemia.  Chest pain is also resolved at this time.  He also had the brief episode of vision loss a few days ago, not entirely sure what to make of this, but again concern for possible hypertensive emergency.  We will start him on a course of amlodipine  for home, and placed a urgent cardiology referral for outpatient follow-up.  Blood the patient discharged home at this time, I spoke with the son over the phone, sidd, who is also in agreement with the plan.  I discussed return precautions, patient is agreeable with the plan.       FINAL CLINICAL IMPRESSION(S) / ED DIAGNOSES   Final diagnoses:  Hypertensive urgency  Chest pain, unspecified type     Rx / DC Orders   ED Discharge Orders          Ordered    Ambulatory referral to Cardiology       Comments: If you have not heard from the Cardiology office within the next 72 hours please call [phone].   12/15/23 1639    amLODipine  (NORVASC ) 5 MG tablet  Daily        12/15/23 1640             Note:  This document was prepared using Dragon voice recognition software and may include unintentional dictation errors.   Fernand Rossie HERO, MD 12/15/23 [phone]

## 2023-12-15 NOTE — Discharge Instructions (Addendum)
 You were seen today due to concern of chest pain.  At this time fortunately your labs are reassuring.  I have placed a referral to our cardiology team, they should contact you to help arrange a follow-up appointment.  If you have any worsening of symptoms such as visual changes, chest pain, or any other symptoms you find concerning please return to the emergency department immediately for further medical management.

## 2023-12-31 ENCOUNTER — Other Ambulatory Visit: Payer: Self-pay

## 2023-12-31 DIAGNOSIS — G459 Transient cerebral ischemic attack, unspecified: Secondary | ICD-10-CM

## 2023-12-31 DIAGNOSIS — I10 Essential (primary) hypertension: Secondary | ICD-10-CM

## 2024-01-14 ENCOUNTER — Ambulatory Visit
Admission: RE | Admit: 2024-01-14 | Discharge: 2024-01-14 | Disposition: A | Discharge status: Home / Self Care | Source: Ambulatory Visit

## 2024-01-14 ENCOUNTER — Ambulatory Visit

## 2024-01-14 DIAGNOSIS — I10 Essential (primary) hypertension: Secondary | ICD-10-CM | POA: Insufficient documentation

## 2024-01-14 DIAGNOSIS — G459 Transient cerebral ischemic attack, unspecified: Secondary | ICD-10-CM | POA: Insufficient documentation

## 2024-01-14 MED ORDER — IOHEXOL 350 MG/ML SOLN
75.0000 mL | Freq: Once | INTRAVENOUS | Status: AC | PRN
  Administered 2024-01-14: 75 mL via INTRAVENOUS
